# Patient Record
Sex: Female | Born: 1955 | Race: White | Hispanic: No | Smoking: Never smoker
Health system: Southern US, Community
[De-identification: ages and names within clinical notes are randomized; demographics above are authoritative.]

## PROBLEM LIST (undated history)

## (undated) DIAGNOSIS — F419 Anxiety disorder, unspecified: Principal | ICD-10-CM

## (undated) DIAGNOSIS — I10 Essential (primary) hypertension: Secondary | ICD-10-CM

## (undated) DIAGNOSIS — F329 Major depressive disorder, single episode, unspecified: Principal | ICD-10-CM

## (undated) DIAGNOSIS — K259 Gastric ulcer, unspecified as acute or chronic, without hemorrhage or perforation: Secondary | ICD-10-CM

## (undated) HISTORY — DX: Anxiety disorder, unspecified: F41.9

## (undated) HISTORY — DX: Major depressive disorder, single episode, unspecified: F32.9

## (undated) HISTORY — DX: Essential (primary) hypertension: I10

---

## 1996-06-02 HISTORY — PX: OTHER SURGICAL HISTORY: SHX169

## 2012-08-31 ENCOUNTER — Ambulatory Visit (INDEPENDENT_AMBULATORY_CARE_PROVIDER_SITE_OTHER): Payer: Self-pay | Admitting: Family Medicine

## 2012-08-31 ENCOUNTER — Encounter: Payer: Self-pay | Admitting: Family Medicine

## 2012-08-31 VITALS — BP 210/115 | HR 100 | Ht 61.0 in | Wt 126.0 lb

## 2012-08-31 DIAGNOSIS — F419 Anxiety disorder, unspecified: Secondary | ICD-10-CM

## 2012-08-31 DIAGNOSIS — F32A Depression, unspecified: Secondary | ICD-10-CM | POA: Insufficient documentation

## 2012-08-31 DIAGNOSIS — I160 Hypertensive urgency: Secondary | ICD-10-CM

## 2012-08-31 DIAGNOSIS — F329 Major depressive disorder, single episode, unspecified: Secondary | ICD-10-CM | POA: Insufficient documentation

## 2012-08-31 DIAGNOSIS — F341 Dysthymic disorder: Secondary | ICD-10-CM

## 2012-08-31 DIAGNOSIS — I10 Essential (primary) hypertension: Secondary | ICD-10-CM | POA: Insufficient documentation

## 2012-08-31 HISTORY — DX: Depression, unspecified: F32.A

## 2012-08-31 HISTORY — DX: Anxiety disorder, unspecified: F41.9

## 2012-08-31 MED ORDER — OLMESARTAN MEDOXOMIL-HCTZ 40-12.5 MG PO TABS: 1.0000 | ORAL_TABLET | Freq: Every day | ORAL | Status: DC

## 2012-08-31 MED ORDER — ALPRAZOLAM 1 MG PO TABS: 1.0000 mg | ORAL_TABLET | Freq: Three times a day (TID) | ORAL | Status: DC | PRN

## 2012-08-31 MED ORDER — OLMESARTAN-AMLODIPINE-HCTZ 40-5-25 MG PO TABS: ORAL_TABLET | ORAL | Status: DC

## 2012-08-31 MED ORDER — FLUOXETINE HCL 20 MG PO CAPS: 20.0000 mg | ORAL_CAPSULE | Freq: Every day | ORAL | Status: DC
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
## 2012-09-01 ENCOUNTER — Telehealth: Payer: Self-pay | Admitting: *Deleted

## 2012-09-01 NOTE — Telephone Encounter (Signed)
Sarah Mclaughlin or Sarah Mclaughlin, Given the degree of her elevated blood pressure yesterday and her symptoms of headache and vomiting I can't safely assume that this is only due to starting the BenicarHCT, unfortunately her situation now warrants Emergency Room attention, I'd recommend that she either call 911 or immediately go to an ED.

## 2012-09-01 NOTE — Telephone Encounter (Signed)
Called [facility] and informed her of MD instructions and she will go to the emergency room as directed. Barry Dienes, LPN

## 2012-09-01 NOTE — Telephone Encounter (Signed)
Patient calls this morning and states was seen yesterday and you put her on Benicar 40/12.5mg  take one daily for 7 days. Woke up during night last night with splitting headache and vomiting, chills. This morning still has splitting H/A and feels awful. Please advise

## 2012-09-06 ENCOUNTER — Telehealth: Payer: Self-pay | Admitting: *Deleted

## 2012-09-06 NOTE — Telephone Encounter (Signed)
Pt given Tribenzor and she has been taking half a tab of this and was throwing up and was told she needed to go to ED on the day she was in here to see you. Pt states she didn't go to ED. She wants to know if you will change her med, Tribenzor is too expensive. Also pt put on Prozac. Wants Prozac lowered to 10mg  dose and wants another med like xanax ?chlor something and she doesn't know what it is called

## 2012-09-07 ENCOUNTER — Telehealth: Payer: Self-pay | Admitting: *Deleted

## 2012-09-07 ENCOUNTER — Encounter: Payer: Self-pay | Admitting: Family Medicine

## 2012-09-07 ENCOUNTER — Ambulatory Visit (INDEPENDENT_AMBULATORY_CARE_PROVIDER_SITE_OTHER): Payer: Self-pay | Admitting: Family Medicine

## 2012-09-07 VITALS — BP 114/82 | HR 106 | Ht 61.0 in | Wt 122.0 lb

## 2012-09-07 DIAGNOSIS — F341 Dysthymic disorder: Secondary | ICD-10-CM

## 2012-09-07 DIAGNOSIS — I10 Essential (primary) hypertension: Secondary | ICD-10-CM

## 2012-09-07 DIAGNOSIS — F419 Anxiety disorder, unspecified: Secondary | ICD-10-CM

## 2012-09-07 MED ORDER — LISINOPRIL-HYDROCHLOROTHIAZIDE 20-25 MG PO TABS: 1.0000 | ORAL_TABLET | Freq: Every day | ORAL | Status: DC

## 2012-09-07 MED ORDER — HYDROXYZINE HCL 25 MG PO TABS: ORAL_TABLET | ORAL | Status: DC

## 2012-09-07 NOTE — Telephone Encounter (Signed)
Pt calls & states that she took the hydroxyzine today & after taking it she began to feel very dizzy & experienced some sob.  She called the pharmacist as well & he told her that those effects would last around 4 hrs.  Just wanted to make you aware.

## 2012-09-07 NOTE — Telephone Encounter (Signed)
Pt notified & is scheduled at 9am this morning.

## 2012-09-07 NOTE — Telephone Encounter (Signed)
Sue Lush, Will you please let Sarah Mclaughlin know that she needs to follow up to dicuss medication management given the degree of her elevated blood pressure.  I'd be more than happy to discuss medication options but I need to make sure her blood pressure is improving to help guide options.

## 2012-09-07 NOTE — Telephone Encounter (Signed)
noted 

## 2012-09-07 NOTE — Patient Instructions (Addendum)
Stop tribenzor, start lisinopril-hctz daily as a cheaper replacement.  For the next week only take half a xanax at bedtime to help wean you off, take with one hydroxyzine 25mg  for this week to help with anxiety/sleep, then just take hydroxyzine as needed.

## 2012-09-07 NOTE — Progress Notes (Signed)
CC: Sarah Mclaughlin is a 57 y.o. female is here for Medication Management   Subjective: HPI:  Followup hypertension: The day she started Benicar HCT she had vomiting and lightheadedness for 5 hours that resolved after a nap. The next day she started tribenzor samples and denies any further nausea, lightheadedness, nor motor or sensory disturbances. No outside blood pressures to report, she recently received a  blood pressure cuff but is not writing down pressures. She denies chest pain, shortness of breath, orthopnea, peripheral edema, muscle cramps, cough, angioedema.  Followup anxiety: Continues on Prozac 20 mg a day. She's been taking a single Xanax every evening right before bed . Her son-in-law and daughter are help managing this medication in hopes to minimize her need for this. She expresses interest in trying to come off Xanax. After discussing the medication with her family.  She reports significant improvement in generalized anxiety. She still has episodes of worsening anxiety when stressing out at work with responsibilities, or when not spending time with her son-in-law and daughter. She denies depression, mental disturbance, paranoia, nor sleep trouble   Review Of Systems Outlined In HPI  Past Medical History  Diagnosis Date  . Hypertension   . Anxiety and depression 08/31/2012     Family History  Problem Relation Age of Onset  . Heart attack Mother     father  . Diabetes Mother   . Hypertension Mother   . Stroke       History  Substance Use Topics  . Smoking status: Never Smoker   . Smokeless tobacco: Not on file  . Alcohol Use: Yes     Comment: occasional     Objective: Filed Vitals:   09/07/12 0859  BP: 114/82  Pulse: 106    General: Alert and Oriented, No Acute Distress HEENT: Pupils equal, round, reactive to light. Conjunctivae clear.   moist mucous membranes  Lungs: Clear to auscultation bilaterally, no wheezing/ronchi/rales.  Comfortable work of  breathing. Good air movement. Cardiac: Regular rate and rhythm. Normal S1/S2.  No murmurs, rubs, nor gallops.   Extremities: No peripheral edema.  Strong peripheral pulses.  Mental Status: No depression, anxiety, nor agitation.Significantly more calm compared to last visit  Skin: Warm and dry.  Assessment & Plan: Sarah Mclaughlin was seen today for medication management.  Diagnoses and associated orders for this visit:  Essential hypertension, benign - lisinopril-hydrochlorothiazide (PRINZIDE,ZESTORETIC) 20-25 MG per tablet; Take 1 tablet by mouth daily.  Anxiety and depression - hydrOXYzine (ATARAX/VISTARIL) 25 MG tablet; One by mouth at bedtime, may take one every 8 hours for anxiety attack.    Essential hypertension: Controlled, patient unable to afford tribenzor. Will change to lisinopril/hydrochlorothiazide for financial reasons and will add amlodipine if needed. Patient agreeable to bringing daily blood pressure log for the next week. Anxiety: Controlled and improved. Stressed importance of Prozac to help minimize anxiety fluctuations, will attempt to wean off of that time Xanax with addition and switch to hydroxyzine.  Return in about 4 weeks (around 10/05/2012).

## 2012-09-13 ENCOUNTER — Encounter: Payer: Self-pay | Admitting: Family Medicine

## 2012-09-13 ENCOUNTER — Ambulatory Visit (INDEPENDENT_AMBULATORY_CARE_PROVIDER_SITE_OTHER): Payer: Self-pay | Admitting: Family Medicine

## 2012-09-13 VITALS — BP 128/95 | HR 98 | Ht 61.0 in | Wt 118.0 lb

## 2012-09-13 DIAGNOSIS — I10 Essential (primary) hypertension: Secondary | ICD-10-CM

## 2012-09-13 DIAGNOSIS — F341 Dysthymic disorder: Secondary | ICD-10-CM

## 2012-09-13 DIAGNOSIS — F329 Major depressive disorder, single episode, unspecified: Secondary | ICD-10-CM

## 2012-09-13 DIAGNOSIS — F419 Anxiety disorder, unspecified: Secondary | ICD-10-CM

## 2012-09-13 MED ORDER — FLUOXETINE HCL 40 MG PO CAPS: 40.0000 mg | ORAL_CAPSULE | Freq: Every day | ORAL | Status: DC

## 2012-09-13 NOTE — Progress Notes (Signed)
CC: Sarah Mclaughlin is a 57 y.o. female is here for Anxiety   Subjective: HPI:  Followup hypertension: Since I saw her last she started lisinopril/hydrochlorothiazide. Only one outside blood pressure reading: 90/60. This occurred when she took her blood pressure medication on empty stomach without drinking much the night before or the morning. Was at work sweating working around the often and had gradual onset of weakness in the legs, lightheadedness. EMS was called and found the above blood pressure. She did not go to the hospital, went home instead and felt better after drinking and eating. Since then she denies chest pain, shortness of breath, orthopnea, peripheral edema, lightheadedness, headache, confusion.  Followup anxiety: Patient states the above event was preceded by extremely high stress at work, unclear job roll and responsibilities were contributing to confusion and anxiety.  She has tried taking hydroxyzine however does not notice any change anxiety for better or worse. She has not taken Xanax in over a week. She denies paranoia, hallucinations, depression, nor mental disturbance other than that described above   Review Of Systems Outlined In HPI  Past Medical History  Diagnosis Date  . Hypertension   . Anxiety and depression 08/31/2012     Family History  Problem Relation Age of Onset  . Heart attack Mother     father  . Diabetes Mother   . Hypertension Mother   . Stroke       History  Substance Use Topics  . Smoking status: Never Smoker   . Smokeless tobacco: Not on file  . Alcohol Use: Yes     Comment: occasional     Objective: Filed Vitals:   09/13/12 0839  BP: 128/95  Pulse: 98    Vital signs reviewed. General: Alert and Oriented, No Acute Distress HEENT: Pupils equal, round, reactive to light. Conjunctivae clear.  External ears unremarkable.  Moist mucous membranes. Lungs: Clear and comfortable work of breathing, speaking in full sentences without  accessory muscle use. No wheezing no rhonchi nor rales Cardiac: Regular rate and rhythm. No murmurs rubs or gallops Neuro: CN II-XII grossly intact, gait normal. Extremities: No peripheral edema.  Strong peripheral pulses.  Mental Status: No depression, anxiety, nor agitation. Logical though process. Skin: Warm and dry.  Assessment & Plan: Sarah Mclaughlin was seen today for anxiety.  Diagnoses and associated orders for this visit:  Essential hypertension, benign  Anxiety and depression - FLUoxetine (PROZAC) 40 MG capsule; Take 1 capsule (40 mg total) by mouth daily.    Essential hypertension: Improved however uncontrolled, discussed diet and exercise interventions including salt restrictions to help with diastolic. Continue lisinopril hydrochlorothiazide and recheck in 4 weeks.  Anxiety: Uncontrolled, will increase Prozac, update me over the phone in one week.   Return in about 4 weeks (around 10/11/2012).

## 2012-09-15 ENCOUNTER — Ambulatory Visit (INDEPENDENT_AMBULATORY_CARE_PROVIDER_SITE_OTHER): Payer: Self-pay | Admitting: Family Medicine

## 2012-09-15 ENCOUNTER — Encounter: Payer: Self-pay | Admitting: Family Medicine

## 2012-09-15 ENCOUNTER — Telehealth: Payer: Self-pay | Admitting: *Deleted

## 2012-09-15 VITALS — BP 106/82 | HR 92 | Wt 119.0 lb

## 2012-09-15 DIAGNOSIS — F411 Generalized anxiety disorder: Secondary | ICD-10-CM

## 2012-09-15 MED ORDER — BUSPIRONE HCL 10 MG PO TABS: 10.0000 mg | ORAL_TABLET | Freq: Two times a day (BID) | ORAL | Status: DC

## 2012-09-15 NOTE — Patient Instructions (Signed)
Take only 20mg  of prozac tomorrow, then start buspar twice a day on Friday.

## 2012-09-15 NOTE — Telephone Encounter (Signed)
Pt notified and has tried the meds but still feels horrible. Informed pt needs to make appointment and transferred her to scheduling. Barry Dienes, LPN

## 2012-09-15 NOTE — Telephone Encounter (Signed)
If this doesn't improve with peptobismol and or over the counter ranitidine 150mg  twice a day it would be best for her to be evaluated in clinic.

## 2012-09-15 NOTE — Progress Notes (Signed)
CC: Sarah Mclaughlin is a 57 y.o. female is here for Anxiety   Subjective: HPI:  Patient complains of an episode yesterday after taking increased dose of Prozac where she felt like a zombie, was moving slowly, had epigastric pain. This occurred at work and she was sent home after above symptoms were noticed by her employer per her report. As soon as she got home she had repeated episodes of vomiting and diarrhea. She went to sleep that afternoon and this morning woke up at 12 AM and has been awake since. She reports decreased appetite but does have the desire to be a big Mac or a shrimp cocktail right now. She denies any discomfort. She still complains of uncontrolled anxiety is present all hours of the day. She denies paranoia, hallucinations, fevers, chills, back pain, right upper quadrant pain, blood in bowels or vomit. Denies confusion.    Review Of Systems Outlined In HPI  Past Medical History  Diagnosis Date  . Hypertension   . Anxiety and depression 08/31/2012     Family History  Problem Relation Age of Onset  . Heart attack Mother     father  . Diabetes Mother   . Hypertension Mother   . Stroke       History  Substance Use Topics  . Smoking status: Never Smoker   . Smokeless tobacco: Not on file  . Alcohol Use: Yes     Comment: occasional     Objective: Filed Vitals:   09/15/12 1308  BP: 106/82  Pulse: 92    General: Alert and Oriented, No Acute Distress HEENT: Pupils equal, round, reactive to light. Conjunctivae clear.  Moist mucous membranes Lungs: Clear to auscultation bilaterally, no wheezing/ronchi/rales.  Comfortable work of breathing. Good air movement. Cardiac: Regular rate and rhythm. Normal S1/S2.  No murmurs, rubs, nor gallops.   Abdomen: Normal bowel sounds, soft and non tender without palpable masses. Extremities: No peripheral edema.  Strong peripheral pulses.  Mental Status: No depression, mild anxiety no agitation Skin: Warm and  dry.  Assessment & Plan: Tanika was seen today for anxiety.  Diagnoses and associated orders for this visit:  Generalized anxiety disorder - busPIRone (BUSPAR) 10 MG tablet; Take 1 tablet (10 mg total) by mouth 2 (two) times daily.    Generalized anxiety disorder: Uncontrolled, patient is concerned about possible side effects from increasing Prozac. She feels anxiety is actually worse since increasing. Discussed this would be unlikely given she's only had one day of increased however she's reserved about continuing this medication. We'll change to BuSpar, see patient instructions, I've asked her to have some blood work done to further workup her GI issues yesterday and reversible causes of anxiety however she declines citing financial issues. She refuses behavioral health referral do to financial issues.   Return in about 2 weeks (around 09/29/2012).

## 2012-09-15 NOTE — Telephone Encounter (Signed)
Pt calls and states that her stomach feels like its in knots under her rib cage, no appetite and when can eat starts vomiting, anxious, not sleeping well, stools are soft. ? What to do?

## 2012-09-21 ENCOUNTER — Telehealth: Payer: Self-pay | Admitting: Family Medicine

## 2012-09-21 DIAGNOSIS — F411 Generalized anxiety disorder: Secondary | ICD-10-CM

## 2012-09-21 MED ORDER — BUSPIRONE HCL 15 MG PO TABS: 15.0000 mg | ORAL_TABLET | Freq: Two times a day (BID) | ORAL | Status: DC

## 2012-09-21 NOTE — Telephone Encounter (Signed)
Patient lost Buspar rx and requesting increased dose.

## 2012-10-04 ENCOUNTER — Ambulatory Visit: Payer: Self-pay | Admitting: Family Medicine

## 2012-10-05 ENCOUNTER — Ambulatory Visit (INDEPENDENT_AMBULATORY_CARE_PROVIDER_SITE_OTHER): Payer: Self-pay | Admitting: Family Medicine

## 2012-10-05 ENCOUNTER — Encounter: Payer: Self-pay | Admitting: Family Medicine

## 2012-10-05 VITALS — BP 121/76 | HR 87 | Wt 122.0 lb

## 2012-10-05 DIAGNOSIS — F341 Dysthymic disorder: Secondary | ICD-10-CM

## 2012-10-05 DIAGNOSIS — F329 Major depressive disorder, single episode, unspecified: Secondary | ICD-10-CM

## 2012-10-05 DIAGNOSIS — I10 Essential (primary) hypertension: Secondary | ICD-10-CM

## 2012-10-05 DIAGNOSIS — F419 Anxiety disorder, unspecified: Secondary | ICD-10-CM

## 2012-10-05 MED ORDER — CITALOPRAM HYDROBROMIDE 20 MG PO TABS: 20.0000 mg | ORAL_TABLET | Freq: Every day | ORAL | Status: DC

## 2012-10-05 NOTE — Patient Instructions (Addendum)
Take half a dose of buspirone twice a day for three days then start half a citalopram a day for three days then a full citalopram 20mg  every day.

## 2012-10-05 NOTE — Progress Notes (Signed)
CC: Sarah Mclaughlin is a 57 y.o. female is here for Weight Gain   Subjective: HPI:  Followup hypertension: Continues on lisinopril hydrochlorothiazide 20-25 once a day. No outside blood pressures to report. She denies any more episodes of dizziness or fatigue that she was experiencing last visit. She denies headaches, motor sensory disturbances, chest pain, shortness of breath, orthopnea, nor peripheral edema. Denies cough or muscle cramping  Followup anxiety: She reports she is feeling fantastic and better than ever, others are noticing her lack of tremor and restlessness. Although her fantastic subjective improvement she is discouraged by increased hunger throughout the day and weight gain. She is wanting to know if his other alternatives to BuSpar that will not cause as much weight gain.  She denies depression, paranoia, hallucinations, nor trouble at work.    Review Of Systems Outlined In HPI  Past Medical History  Diagnosis Date  . Hypertension   . Anxiety and depression 08/31/2012     Family History  Problem Relation Age of Onset  . Heart attack Mother     father  . Diabetes Mother   . Hypertension Mother   . Stroke       History  Substance Use Topics  . Smoking status: Never Smoker   . Smokeless tobacco: Not on file  . Alcohol Use: Yes     Comment: occasional     Objective: Filed Vitals:   10/05/12 1123  BP: 121/76  Pulse: 87    Vital signs reviewed. General: Alert and Oriented, No Acute Distress HEENT: Pupils equal, round, reactive to light. Conjunctivae clear.  External ears unremarkable.  Moist mucous membranes. Lungs: Clear and comfortable work of breathing, speaking in full sentences without accessory muscle use. Cardiac: Regular rate and rhythm.  Neuro: CN II-XII grossly intact, gait normal. Extremities: No peripheral edema.  Strong peripheral pulses.  Mental Status: No depression, anxiety, nor agitation. Logical though process. Skin: Warm and  dry.  Assessment & Plan: Sarah Mclaughlin was seen today for weight gain.  Diagnoses and associated orders for this visit:  Anxiety and depression - Discontinue: citalopram (CELEXA) 20 MG tablet; Take 1 tablet (20 mg total) by mouth daily. - citalopram (CELEXA) 20 MG tablet; Take 1 tablet (20 mg total) by mouth daily. May increase to two a day after a week if anxiety not controlled.  Essential hypertension, benign    Anxiety and depression: Controlled however patient is particularly bothered by weight gain, discussed medication change may worsen or progress she's made from a psych standpoint, she would still prefer to try alternative and a regimen of Celexa was provided. Discussed that almost all SSRI/SNR I agents have the potential to cause weight gain. Essential hypertension: Controlled, continue lisinopril hydrochlorothiazide, declines labs due to financial reasons  Return in about 4 weeks (around 11/02/2012).

## 2012-10-21 ENCOUNTER — Telehealth: Payer: Self-pay | Admitting: *Deleted

## 2012-10-21 NOTE — Telephone Encounter (Signed)
Pt calls today stating that for the last week she has been experiencing SOB, dizzy & nausea spells, and she feels so exhausted in the mornings.  She has been on Celexa since 5-6.  Her labored breathing was very audible on the phone as I was speaking with her.  I advised her to go to the ER but she did not want to because she has no ins.  She made an appt with you for tmrw but I strongly urged her to go to the hosp if she got ANY worse.  Just wanted to give you a heads up.

## 2012-10-22 ENCOUNTER — Encounter: Payer: Self-pay | Admitting: Family Medicine

## 2012-10-22 ENCOUNTER — Ambulatory Visit (INDEPENDENT_AMBULATORY_CARE_PROVIDER_SITE_OTHER): Payer: Self-pay | Admitting: Family Medicine

## 2012-10-22 VITALS — BP 94/69 | HR 107 | Wt 123.0 lb

## 2012-10-22 DIAGNOSIS — F419 Anxiety disorder, unspecified: Secondary | ICD-10-CM

## 2012-10-22 DIAGNOSIS — R0602 Shortness of breath: Secondary | ICD-10-CM

## 2012-10-22 DIAGNOSIS — I10 Essential (primary) hypertension: Secondary | ICD-10-CM

## 2012-10-22 DIAGNOSIS — F341 Dysthymic disorder: Secondary | ICD-10-CM

## 2012-10-22 MED ORDER — LISINOPRIL-HYDROCHLOROTHIAZIDE 20-25 MG PO TABS: 0.5000 | ORAL_TABLET | Freq: Every day | ORAL | Status: DC

## 2012-10-22 MED ORDER — PAROXETINE HCL 20 MG PO TABS: 20.0000 mg | ORAL_TABLET | Freq: Every day | ORAL | Status: DC

## 2012-10-22 NOTE — Progress Notes (Signed)
CC: Sarah Mclaughlin is a 57 y.o. female is here for Anxiety   Subjective: HPI:  Patient complains of lightheadedness, shortness of breath, epigastric discomfort all started about one week after starting citalopram. Symptoms worsening on a weekly basis all of which are mild to moderate in severity. Lightheadedness is worse when standing from a seated position and improves with lying down or with time. Nothing else makes better or worse. Shortness of breath is noted with exertion and with periods of psychological stress and improves with left over Klonopin. She denies wheezing, cough, chest pain, orthopnea, peripheral edema, nor nasal congestion. Epigastric pain is described as a lump in her stomach that is nonradiating nothing makes better or worse. She also notes that she has dry heaves and nausea for one to 2 hours after taking daily dose of citalopram. She does report a history of stomach ulcers decades ago. No alcohol or nonsteroidal anti-inflammatory use.    Review Of Systems Outlined In HPI  Past Medical History  Diagnosis Date  . Hypertension   . Anxiety and depression 08/31/2012     Family History  Problem Relation Age of Onset  . Heart attack Mother     father  . Diabetes Mother   . Hypertension Mother   . Stroke       History  Substance Use Topics  . Smoking status: Never Smoker   . Smokeless tobacco: Not on file  . Alcohol Use: Yes     Comment: occasional     Objective: Filed Vitals:   10/22/12 1017  BP: 94/69  Pulse: 107    General: Alert and Oriented, No Acute Distress HEENT: Pupils equal, round, reactive to light. Conjunctivae clear.  External ears unremarkable, canals clear with intact TMs with appropriate landmarks.  Middle ear appears open without effusion. Pink inferior turbinates.  Moist mucous membranes, pharynx without inflammation nor lesions.  Neck supple without palpable lymphadenopathy nor abnormal masses. Lungs: Clear to auscultation bilaterally, no  wheezing/ronchi/rales.  Comfortable work of breathing. Good air movement. Cardiac: Regular rate and rhythm. Normal S1/S2.  No murmurs, rubs, nor gallops.   Abdomen: Normal bowel sounds, soft and non tender without palpable masses. Extremities: No peripheral edema.  Strong peripheral pulses.  Mental Status: No depression, anxiety, nor agitation. Skin: Warm and dry.  Assessment & Plan: Tenna was seen today for anxiety.  Diagnoses and associated orders for this visit:  Anxiety and depression - PARoxetine (PAXIL) 20 MG tablet; Take 1 tablet (20 mg total) by mouth daily.  Essential hypertension, benign - lisinopril-hydrochlorothiazide (PRINZIDE,ZESTORETIC) 20-25 MG per tablet; Take 0.5 tablets by mouth daily.  Shortness of breath - POCT hemoglobin    Anxiety and depression: Controlled, epigastric pain and nausea may be a side effect of citalopram, she is quite worried about the side effect possibility therefore will switch to Paxil for anxiety control. Essential hypertension: Controlled, treatment likely too aggressive at this time causing symptomatic hypo-tension therefore cut tablet in half taking only half a dose today Shortness of breath: I've asked her to get a d-dimer to rule out PE however she refuses,  No sign of infection nor heart failure, she would prefer to withhold further investigation into trying the adjustments above  Return in about 4 weeks (around 11/19/2012).

## 2012-10-26 ENCOUNTER — Telehealth: Payer: Self-pay | Admitting: *Deleted

## 2012-10-26 DIAGNOSIS — F411 Generalized anxiety disorder: Secondary | ICD-10-CM

## 2012-10-26 MED ORDER — CLONAZEPAM 0.5 MG PO TABS: 0.2500 mg | ORAL_TABLET | Freq: Two times a day (BID) | ORAL | Status: DC | PRN

## 2012-10-26 NOTE — Telephone Encounter (Signed)
Pt notified and rx faxed 

## 2012-10-26 NOTE — Telephone Encounter (Signed)
Sue Lush, Rx for clonopin placed in you inbox ready for faxing or pickup.
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
## 2012-11-03 ENCOUNTER — Ambulatory Visit (INDEPENDENT_AMBULATORY_CARE_PROVIDER_SITE_OTHER): Payer: Self-pay | Admitting: Family Medicine

## 2012-11-03 ENCOUNTER — Other Ambulatory Visit: Payer: Self-pay | Admitting: Family Medicine

## 2012-11-03 ENCOUNTER — Encounter: Payer: Self-pay | Admitting: Family Medicine

## 2012-11-03 VITALS — BP 139/103 | HR 91 | Wt 127.0 lb

## 2012-11-03 DIAGNOSIS — F419 Anxiety disorder, unspecified: Secondary | ICD-10-CM

## 2012-11-03 DIAGNOSIS — F329 Major depressive disorder, single episode, unspecified: Secondary | ICD-10-CM

## 2012-11-03 DIAGNOSIS — F341 Dysthymic disorder: Secondary | ICD-10-CM

## 2012-11-03 MED ORDER — CITALOPRAM HYDROBROMIDE 20 MG PO TABS: ORAL_TABLET | ORAL | Status: DC

## 2012-11-03 NOTE — Telephone Encounter (Signed)
Pt & pharmacy notified of med instructions.
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
## 2012-11-03 NOTE — Telephone Encounter (Signed)
(  For documentation purposes, her updated med-list was printed and then reviewed at today's encounter numerous times)  Sarah Mclaughlin, I have recommended that she stop the Paxil/paroxetine and go back to Citalopram/celexa 20mg  for the first four days then two by mouth daily thereafter.  She can take both hydroxyzine and clonazepam, hydroxyzine is first line for as needed anxiety treatment, clonazepam is second line for as needed anxiety treatment.  Hope this helps.

## 2012-11-03 NOTE — Telephone Encounter (Signed)
Pharmacy calls and states this patient called them and was confused about her medicine. She doesn't know if she is supposed to take her hydroxyzine along with the Klonopin and does she take the Celexa and Paxil. I see where you said stop Paxil and resume the Celexa as this worked best for her. Can you clarify the other for me please. Barry Dienes, LPN

## 2012-11-05 ENCOUNTER — Telehealth: Payer: Self-pay | Admitting: *Deleted

## 2012-11-05 DIAGNOSIS — F411 Generalized anxiety disorder: Secondary | ICD-10-CM

## 2012-11-05 MED ORDER — CLONAZEPAM 0.5 MG PO TABS: 0.2500 mg | ORAL_TABLET | Freq: Two times a day (BID) | ORAL | Status: DC | PRN

## 2012-11-05 NOTE — Telephone Encounter (Signed)
Pt calls and wants to know if you can call in the Klonopin 0.5mg  to [facility]. Leaving out of town in 2 hours and really needs this before she leaves.

## 2012-11-05 NOTE — Telephone Encounter (Signed)
Pt notified of instructions on meds. Barry Dienes, LPN

## 2012-11-05 NOTE — Telephone Encounter (Signed)
Pt calls and and ask if you will increase the Klonopin to taking a whole tab instead of 1/2 twice a day. States she has been taking a whole tab 2 times a day because of stress and anxiety right now

## 2012-11-05 NOTE — Telephone Encounter (Signed)
Rx printed ready for faxing off. Given to [place].

## 2012-11-05 NOTE — Telephone Encounter (Signed)
It is in her best interest to stick with just half a tab twice a day to minimize the risk of developing a tolerance or dependency on this type of medication.  I would recommend taking two of the hydroxyzine every 8 hours since this medication is not associated with dependency.

## 2012-11-15 ENCOUNTER — Ambulatory Visit (INDEPENDENT_AMBULATORY_CARE_PROVIDER_SITE_OTHER): Payer: Self-pay | Admitting: Family Medicine

## 2012-11-15 ENCOUNTER — Encounter: Payer: Self-pay | Admitting: Family Medicine

## 2012-11-15 VITALS — BP 104/76 | HR 100 | Wt 127.0 lb

## 2012-11-15 DIAGNOSIS — F341 Dysthymic disorder: Secondary | ICD-10-CM

## 2012-11-15 DIAGNOSIS — F419 Anxiety disorder, unspecified: Secondary | ICD-10-CM

## 2012-11-15 DIAGNOSIS — F411 Generalized anxiety disorder: Secondary | ICD-10-CM

## 2012-11-15 DIAGNOSIS — I10 Essential (primary) hypertension: Secondary | ICD-10-CM

## 2012-11-15 MED ORDER — LISINOPRIL-HYDROCHLOROTHIAZIDE 20-25 MG PO TABS: 0.5000 | ORAL_TABLET | Freq: Every day | ORAL | Status: DC

## 2012-11-15 MED ORDER — CITALOPRAM HYDROBROMIDE 20 MG PO TABS: ORAL_TABLET | ORAL | Status: DC

## 2012-11-15 MED ORDER — CLONAZEPAM 0.5 MG PO TABS: 0.5000 mg | ORAL_TABLET | Freq: Three times a day (TID) | ORAL | Status: DC | PRN

## 2012-11-15 NOTE — Progress Notes (Signed)
CC: Sarah Mclaughlin is a 57 y.o. female is here for Anxiety   Subjective: HPI:  Followup anxiety: Patient currently states anxiety level is moderate to severe, worse during periods of inactivity while not at work, for example when bored at home. Describes restlessness without fear of anything in particular. She restarted Celexa but stopped after a week getting no improvement. She was taking Klonopin a full tablet twice a day due to confusion about dosing regimen, this did help symptoms come down to moderate degree. Denies depression, paranoia, manic-like episodes, nor mental disturbance otherwise. Denies fevers, chills, unintentional weight loss, nausea nor GI disturbance   Review Of Systems Outlined In HPI  Past Medical History  Diagnosis Date  . Hypertension   . Anxiety and depression 08/31/2012     Family History  Problem Relation Age of Onset  . Heart attack Mother     father  . Diabetes Mother   . Hypertension Mother   . Stroke       History  Substance Use Topics  . Smoking status: Never Smoker   . Smokeless tobacco: Not on file  . Alcohol Use: Yes     Comment: occasional     Objective: Filed Vitals:   11/15/12 1541  BP: 104/76  Pulse: 100    Vital signs reviewed. General: Alert and Oriented, No Acute Distress HEENT: Pupils equal, round, reactive to light. Conjunctivae clear.  External ears unremarkable.  Moist mucous membranes. Lungs: Clear and comfortable work of breathing, speaking in full sentences without accessory muscle use. Cardiac: Regular rate and rhythm.  Neuro: CN II-XII grossly intact, gait normal. Extremities: No peripheral edema.  Strong peripheral pulses.  Mental Status: Mild agitation and anxiety, no depression Logical though process. Skin: Warm and dry.  Assessment & Plan: Sarah Mclaughlin was seen today for anxiety.  Diagnoses and associated orders for this visit:  Generalized anxiety disorder - clonazePAM (KLONOPIN) 0.5 MG tablet; Take 1 tablet  (0.5 mg total) by mouth 3 (three) times daily as needed for anxiety.  Essential hypertension, benign - lisinopril-hydrochlorothiazide (PRINZIDE,ZESTORETIC) 20-25 MG per tablet; Take 0.5 tablets by mouth daily.  Anxiety and depression - citalopram (CELEXA) 20 MG tablet; One by mouth daily for the first four days then two by mouth daily.    Generalized anxiety disorder: Uncontrolled, stressed the importance of taking Celexa on a daily basis for the best benefit.  Given her overall lack of improving symptoms will start 3 times a day as needed dosing for Klonopin. She is still trying to get in [facility]  Return in about 4 weeks (around 12/13/2012).

## 2012-11-17 ENCOUNTER — Telehealth: Payer: Self-pay | Admitting: *Deleted

## 2012-11-17 NOTE — Telephone Encounter (Signed)
Left message on pt's vm and faxed letter this am to number she provided

## 2012-11-17 NOTE — Telephone Encounter (Signed)
Patient calls and LMOM wanting to know if you would fax a note to her work that's brief stating she was seen in office on 11/15/2012.  Fax to [phone]

## 2012-11-17 NOTE — Telephone Encounter (Signed)
Sue Lush, Note in inbox ready for pickup/fax

## 2012-11-22 ENCOUNTER — Encounter: Payer: Self-pay | Admitting: Family Medicine

## 2012-11-22 ENCOUNTER — Ambulatory Visit (INDEPENDENT_AMBULATORY_CARE_PROVIDER_SITE_OTHER): Payer: Self-pay | Admitting: Family Medicine

## 2012-11-22 VITALS — BP 101/69 | HR 83 | Temp 98.1°F | Resp 16 | Ht 61.0 in | Wt 127.0 lb

## 2012-11-22 DIAGNOSIS — S0010XA Contusion of unspecified eyelid and periocular area, initial encounter: Secondary | ICD-10-CM

## 2012-11-22 DIAGNOSIS — M79609 Pain in unspecified limb: Secondary | ICD-10-CM

## 2012-11-22 DIAGNOSIS — M79671 Pain in right foot: Secondary | ICD-10-CM

## 2012-11-22 DIAGNOSIS — S0012XA Contusion of left eyelid and periocular area, initial encounter: Secondary | ICD-10-CM

## 2012-11-22 MED ORDER — DICLOFENAC SODIUM 50 MG PO TBEC: DELAYED_RELEASE_TABLET | ORAL | Status: DC

## 2012-11-22 NOTE — Progress Notes (Signed)
CC: Sarah Mclaughlin is a 57 y.o. female is here for Eye Injury and Foot Injury   Subjective: HPI:  Patient complains of left eye with swelling along with discomfort that has been present ever since this morning. Both are described as moderate in severity. She reports tripping over a vacuum cleaner cord yesterday afternoon fell into a coffee table striking her right eyebrow. Since the accident until she went to bed she reports mild forehead tenderness but no swelling nor vision loss nor pain with movement of the left eye.  She is unable to lift the left eyelid due to swelling. She has tried icing for a few minutes this morning which helped the pain nothing else is made swelling or pain better or worse in her. She denies pain with movement of the left eye, she denies radiation of pain And localizes it to the left eyebrow. She denies headache, dizziness, motor sensory disturbances. She denies discharge from the left eye no foreign body sensation in the left eye. She denies fevers, chills, hearing loss, coordination difficulties or memory loss  She complains that she twisted her right foot while falling. It is described as a mild soreness localized on the anterior aspect of the ankle nonradiating. There is some bruising but no swelling. It hurts to walk but she was able to bear weight immediately and since.    Review Of Systems Outlined In HPI  Past Medical History  Diagnosis Date  . Hypertension   . Anxiety and depression 08/31/2012     Family History  Problem Relation Age of Onset  . Heart attack Mother     father  . Diabetes Mother   . Hypertension Mother   . Stroke       History  Substance Use Topics  . Smoking status: Never Smoker   . Smokeless tobacco: Not on file  . Alcohol Use: Yes     Comment: occasional     Objective: Filed Vitals:   11/22/12 0920  BP: 101/69  Pulse: 83  Temp: 98.1 F (36.7 C)  Resp: 16    General: Alert and Oriented, No Acute Distress HEENT:  Pupils equal, round, reactive to light. Conjunctivae clear.   the left upper lid has a moderate hematoma, she is unable to lift the left eyelid, I can passively with this with discomfort and she confirms vision is maintained after lifting the lid in the left eye .External ears unremarkable, canals clear with intact TMs with appropriate landmarks.  Middle ear appears open without effusion. Pink inferior turbinates.  Moist mucous membranes, pharynx without inflammation nor lesions.  Neck supple without palpable lymphadenopathy nor abnormal masses. Lungs:  clear comfortable work of breathing  Cardiac: Regular rate and rhythm.  Extremities: No peripheral edema.  Strong peripheral pulses. She's able to bear weight and walk across the clinic with mild discomfort of right ankle. Right ankle: Anterior drawer negative full range of motion and strength, no pain at base of fifth metatarsal, no pain of lateral or medial malleoli, no navicular pain, there is mild ecchymosis without swelling in the anterior aspect of the ankle. Neuro: Cranial nerves II through XII grossly intact  Mental Status: No depression, anxiety, nor agitation. Skin: Warm and dry.  Assessment & Plan: Sarah Mclaughlin was seen today for eye injury and foot injury.  Diagnoses and associated orders for this visit:  Right foot pain - diclofenac (VOLTAREN) 50 MG EC tablet; Take one tablet every 8 hours only as needed for pain, take with small snack.  Contusion of left eyelid, initial encounter - diclofenac (VOLTAREN) 50 MG EC tablet; Take one tablet every 8 hours only as needed for pain, take with small snack.     right foot pain: Discussed with patient I think this is fortunately just a sprain and contusion, focus on RICE. Alphabet range of motion exercises Her left eyelid contusion looks quite remarkable, fortunately I do not believe she's injured her eye/globe, she was unable to do Snellen due to pain of passively lift in left eyelid.  Vision is  grossly intact on exam, discussed icing and warning signs to look out for with respect to neuro compromise.  Return if symptoms worsen or fail to improve.

## 2012-11-24 ENCOUNTER — Telehealth: Payer: Self-pay | Admitting: *Deleted

## 2012-11-24 DIAGNOSIS — R519 Headache, unspecified: Secondary | ICD-10-CM

## 2012-11-24 MED ORDER — MELOXICAM 15 MG PO TABS: 15.0000 mg | ORAL_TABLET | Freq: Every day | ORAL | Status: DC

## 2012-11-24 NOTE — Telephone Encounter (Signed)
Patient called stated she has been calling all day and know one has returned her phone call. She is a patient of Dr. Genelle Bal I advised her that Sue Lush is not in office. Pt req a call back thanks. [phone]

## 2012-11-24 NOTE — Telephone Encounter (Signed)
Pt calls and states that the med you gave her Monday she can not keep anything down with taking it and her eye is still swollen and the swelling has moved down to her cheek and jawbone and in a lot of pain and looks horrible. Wants to know what to do if needs different med or what?

## 2012-11-24 NOTE — Telephone Encounter (Signed)
Already spoke to Sarah Mclaughlin to relay message that i'm worried about the vomiting and worsening swelling, I've encouraged her to go to a local ED now for evaluation as I think she most likely needs a CT scan of the head.  I've sent in mobic to be tried if the ED does not address the pain adequately.

## 2012-11-24 NOTE — Telephone Encounter (Signed)
Spoke with Dr. Ivan Anchors and he advised me to inform pt to go to emergency room as it sounds like it is getting worse and does not need to wait until tomorrow. Also he is sending in a new med Mobic that she can take and stop the diclofenac. Spoke with pt and informed her of his instructions and she will proceed to ED. Barry Dienes, LPN

## 2012-12-07 ENCOUNTER — Other Ambulatory Visit: Payer: Self-pay | Admitting: Family Medicine

## 2012-12-07 DIAGNOSIS — F411 Generalized anxiety disorder: Secondary | ICD-10-CM

## 2012-12-08 NOTE — Telephone Encounter (Signed)
Sarah Mclaughlin, Rx in inbox ready for pickup/fax. 

## 2012-12-13 ENCOUNTER — Ambulatory Visit (INDEPENDENT_AMBULATORY_CARE_PROVIDER_SITE_OTHER): Payer: Self-pay | Admitting: Family Medicine

## 2012-12-13 ENCOUNTER — Ambulatory Visit (INDEPENDENT_AMBULATORY_CARE_PROVIDER_SITE_OTHER): Payer: Self-pay

## 2012-12-13 ENCOUNTER — Encounter: Payer: Self-pay | Admitting: Family Medicine

## 2012-12-13 VITALS — BP 146/89 | HR 81 | Temp 98.0°F | Wt 137.0 lb

## 2012-12-13 DIAGNOSIS — R319 Hematuria, unspecified: Secondary | ICD-10-CM

## 2012-12-13 DIAGNOSIS — K920 Hematemesis: Secondary | ICD-10-CM

## 2012-12-13 DIAGNOSIS — R143 Flatulence: Secondary | ICD-10-CM

## 2012-12-13 DIAGNOSIS — R1013 Epigastric pain: Secondary | ICD-10-CM

## 2012-12-13 DIAGNOSIS — R141 Gas pain: Secondary | ICD-10-CM

## 2012-12-13 DIAGNOSIS — R109 Unspecified abdominal pain: Secondary | ICD-10-CM

## 2012-12-13 LAB — POCT URINALYSIS DIPSTICK
Protein, UA: NEGATIVE
Spec Grav, UA: 1.015
Urobilinogen, UA: 0.2

## 2012-12-13 MED ORDER — DEXLANSOPRAZOLE 60 MG PO CPDR: 60.0000 mg | DELAYED_RELEASE_CAPSULE | Freq: Every day | ORAL | Status: DC

## 2012-12-13 MED ORDER — HYDROCODONE-ACETAMINOPHEN 5-325 MG PO TABS: 1.0000 | ORAL_TABLET | Freq: Four times a day (QID) | ORAL | Status: DC | PRN

## 2012-12-13 NOTE — Progress Notes (Signed)
CC: Sarah Mclaughlin is a 57 y.o. female is here for Hematuria   Subjective: HPI:  Patient complains of 3 weeks of abdominal bloating and epigastric pain that is moderate severity present on a daily basis associated with decreased appetite. Over the past week she has vomited one to 3 times a day with scant coffee ground emesis otherwise appears to be partially digested food. She has changed her diet to a bland diet without much improvement. She also reports a daily bowel movement painless but black appearance. Pain is described as burning and sharp that radiates into the back. Nothing particularly makes it better or worse no interventions as of yet. Food does not seem to make better or worse. She has had almost no nausea. She does not believe that she has had peripheral edema or swelling she has not changed her diet other than above. She does endorse as she describes it very little shortness of breath and fatigue.  She has been using nonsteroidal anti-inflammatories either Mobic or ibuprofen on a daily basis she's unsure of the dosages. She is not a smoker and rarely drinks alcohol. She denies right upper quadrant pain, skin or scleral discoloration, fevers, chills, confusion, shortness of breath, diarrhea, pelvic pain, flank pain.  She mentions that she has noticed a streak of blood after wiping her urethra the past 2 days without dysuria, frequency, urgency, nor hesitancy. She tells me she is noticed no vaginal bleeding and has not had a period in almost 5 years. She denies any vaginal discharge rectal pain or rectal bleeding.   Review Of Systems Outlined In HPI  Past Medical History  Diagnosis Date  . Hypertension   . Anxiety and depression 08/31/2012     Family History  Problem Relation Age of Onset  . Heart attack Mother     father  . Diabetes Mother   . Hypertension Mother   . Stroke       History  Substance Use Topics  . Smoking status: Never Smoker   . Smokeless tobacco: Not on  file  . Alcohol Use: Yes     Comment: occasional     Objective: Filed Vitals:   12/13/12 1317  BP: 146/89  Pulse: 81  Temp: 98 F (36.7 C)    General: Alert and Oriented, No Acute Distress HEENT: Pupils equal, round, reactive to light. Conjunctivae clear.  Moist mucous membranes pharynx unremarkable Lungs: Clear to auscultation bilaterally, no wheezing/ronchi/rales.  Comfortable work of breathing. Good air movement. Cardiac: Regular rate and rhythm. Normal S1/S2.  No murmurs, rubs, nor gallops.   Abdomen: Normal bowel sounds, soft without palpable masses, she does appear mildly bloated compared to baseline, there is epigastric tenderness without guarding or rebound without pain in any other quadrant Extremities: No peripheral edema.  Strong peripheral pulses.  Mental Status: No depression, anxiety, nor agitation. Skin: Warm and dry.  Assessment & Plan: Sarah Mclaughlin was seen today for hematuria.  Diagnoses and associated orders for this visit:  Hematuria - Urinalysis Dipstick - Urine Culture  Coffee ground emesis - CBC - COMPLETE METABOLIC PANEL WITH GFR - Lipase - DG Abd 2 Views; Future - HYDROcodone-acetaminophen (NORCO/VICODIN) 5-325 MG per tablet; Take 1 tablet by mouth every 6 (six) hours as needed for pain. - dexlansoprazole (DEXILANT) 60 MG capsule; Take 1 capsule (60 mg total) by mouth daily. - Ambulatory referral to Gastroenterology  Abdominal pain, epigastric - HYDROcodone-acetaminophen (NORCO/VICODIN) 5-325 MG per tablet; Take 1 tablet by mouth every 6 (six) hours as needed  for pain. - dexlansoprazole (DEXILANT) 60 MG capsule; Take 1 capsule (60 mg total) by mouth daily. - Ambulatory referral to Gastroenterology    Coffee ground emesis: There is concern for gastric ulcer with bleeding fortunately I believe she does not warrant emergent intervention but we will attempt to arrange GI referral either today or tomorrow for consideration of upper endoscopy. Obtain  platelets and hemoglobin, will rule out hepatic or pancreatic etiology of pain with the above labs. Stop all nonsteroidal anti-inflammatories, sample of dexillant provided until she is able to see GI 10 days given. Abdominal film obtained to rule out obstruction fortunately without signs of obstruction today.Signs and symptoms requring emergent/urgent reevaluation were discussed with the patient. Hematuria confirmed on dipstick, without classic urinary tract symptoms will hold off until cultures available.  Return if symptoms worsen or fail to improve.

## 2012-12-14 ENCOUNTER — Telehealth: Payer: Self-pay | Admitting: *Deleted

## 2012-12-14 ENCOUNTER — Encounter: Payer: Self-pay | Admitting: Family Medicine

## 2012-12-15 ENCOUNTER — Telehealth: Payer: Self-pay | Admitting: *Deleted

## 2012-12-15 LAB — URINE CULTURE: Colony Count: NO GROWTH

## 2012-12-15 NOTE — Telephone Encounter (Signed)
Pt appt with [facility] was scheduled for this past Mon at 945 With [facility]. Pt was aware of appt time and did keep appt

## 2012-12-17 ENCOUNTER — Emergency Department (HOSPITAL_COMMUNITY)
Admission: EM | Admit: 2012-12-17 | Discharge: 2012-12-18 | Disposition: A | Discharge status: Home / Self Care | Payer: 59 | Attending: Emergency Medicine | Admitting: Emergency Medicine

## 2012-12-17 ENCOUNTER — Ambulatory Visit (INDEPENDENT_AMBULATORY_CARE_PROVIDER_SITE_OTHER): Payer: Self-pay | Admitting: Family Medicine

## 2012-12-17 ENCOUNTER — Emergency Department (HOSPITAL_COMMUNITY): Payer: Self-pay

## 2012-12-17 ENCOUNTER — Other Ambulatory Visit: Payer: Self-pay | Admitting: Family Medicine

## 2012-12-17 ENCOUNTER — Encounter (HOSPITAL_COMMUNITY): Payer: Self-pay | Admitting: *Deleted

## 2012-12-17 ENCOUNTER — Encounter: Payer: Self-pay | Admitting: Family Medicine

## 2012-12-17 ENCOUNTER — Telehealth: Payer: Self-pay | Admitting: *Deleted

## 2012-12-17 VITALS — BP 132/84 | HR 74 | Wt 137.0 lb

## 2012-12-17 DIAGNOSIS — F22 Delusional disorders: Secondary | ICD-10-CM

## 2012-12-17 DIAGNOSIS — M7989 Other specified soft tissue disorders: Secondary | ICD-10-CM | POA: Insufficient documentation

## 2012-12-17 DIAGNOSIS — R1110 Vomiting, unspecified: Secondary | ICD-10-CM | POA: Insufficient documentation

## 2012-12-17 DIAGNOSIS — R634 Abnormal weight loss: Secondary | ICD-10-CM | POA: Insufficient documentation

## 2012-12-17 DIAGNOSIS — F411 Generalized anxiety disorder: Secondary | ICD-10-CM | POA: Insufficient documentation

## 2012-12-17 DIAGNOSIS — R443 Hallucinations, unspecified: Secondary | ICD-10-CM | POA: Insufficient documentation

## 2012-12-17 DIAGNOSIS — Z79899 Other long term (current) drug therapy: Secondary | ICD-10-CM | POA: Insufficient documentation

## 2012-12-17 DIAGNOSIS — F1110 Opioid abuse, uncomplicated: Secondary | ICD-10-CM | POA: Insufficient documentation

## 2012-12-17 DIAGNOSIS — R42 Dizziness and giddiness: Secondary | ICD-10-CM | POA: Insufficient documentation

## 2012-12-17 DIAGNOSIS — F333 Major depressive disorder, recurrent, severe with psychotic symptoms: Secondary | ICD-10-CM

## 2012-12-17 DIAGNOSIS — R5381 Other malaise: Secondary | ICD-10-CM | POA: Insufficient documentation

## 2012-12-17 DIAGNOSIS — R630 Anorexia: Secondary | ICD-10-CM | POA: Insufficient documentation

## 2012-12-17 DIAGNOSIS — Z8719 Personal history of other diseases of the digestive system: Secondary | ICD-10-CM | POA: Insufficient documentation

## 2012-12-17 DIAGNOSIS — K259 Gastric ulcer, unspecified as acute or chronic, without hemorrhage or perforation: Secondary | ICD-10-CM | POA: Insufficient documentation

## 2012-12-17 DIAGNOSIS — I10 Essential (primary) hypertension: Secondary | ICD-10-CM | POA: Insufficient documentation

## 2012-12-17 DIAGNOSIS — R4182 Altered mental status, unspecified: Secondary | ICD-10-CM

## 2012-12-17 DIAGNOSIS — K253 Acute gastric ulcer without hemorrhage or perforation: Secondary | ICD-10-CM

## 2012-12-17 HISTORY — DX: Gastric ulcer, unspecified as acute or chronic, without hemorrhage or perforation: K25.9

## 2012-12-17 LAB — CBC
Hemoglobin: 8.1 g/dL — ABNORMAL LOW (ref 12.0–15.0)
MCHC: 32.4 g/dL (ref 30.0–36.0)
Platelets: 372 10*3/uL (ref 150–400)
RDW: 14.1 % (ref 11.5–15.5)

## 2012-12-17 LAB — COMPREHENSIVE METABOLIC PANEL
ALT: 16 U/L (ref 0–35)
AST: 22 U/L (ref 0–37)
Albumin: 2.9 g/dL — ABNORMAL LOW (ref 3.5–5.2)
Alkaline Phosphatase: 97 U/L (ref 39–117)
Glucose, Bld: 105 mg/dL — ABNORMAL HIGH (ref 70–99)
Potassium: 4.9 mEq/L (ref 3.5–5.1)
Sodium: 136 mEq/L (ref 135–145)
Total Protein: 7.1 g/dL (ref 6.0–8.3)

## 2012-12-17 LAB — RAPID URINE DRUG SCREEN, HOSP PERFORMED
Amphetamines: NOT DETECTED
Barbiturates: NOT DETECTED
Benzodiazepines: NOT DETECTED
Cocaine: NOT DETECTED
Opiates: POSITIVE — AB
Tetrahydrocannabinol: NOT DETECTED

## 2012-12-17 LAB — ACETAMINOPHEN LEVEL: Acetaminophen (Tylenol), Serum: <15 ug/mL (ref 10–30)

## 2012-12-17 MED ORDER — LORAZEPAM 1 MG PO TABS
1.0000 mg | ORAL_TABLET | Freq: Three times a day (TID) | ORAL | Status: DC | PRN
  Administered 2012-12-17 – 2012-12-18 (×3): 1 mg via ORAL
  Filled 2012-12-17 (×3): qty 1

## 2012-12-17 MED ORDER — IBUPROFEN 200 MG PO TABS: 600.0000 mg | ORAL_TABLET | Freq: Three times a day (TID) | ORAL | Status: DC | PRN

## 2012-12-17 MED ORDER — ZOLPIDEM TARTRATE 5 MG PO TABS
5.0000 mg | ORAL_TABLET | Freq: Every evening | ORAL | Status: DC | PRN
  Administered 2012-12-17: 5 mg via ORAL
  Filled 2012-12-17: qty 1

## 2012-12-17 MED ORDER — NICOTINE 21 MG/24HR TD PT24: 21.0000 mg | MEDICATED_PATCH | Freq: Every day | TRANSDERMAL | Status: DC | PRN

## 2012-12-17 MED ORDER — ACETAMINOPHEN 325 MG PO TABS
650.0000 mg | ORAL_TABLET | ORAL | Status: DC | PRN
  Administered 2012-12-18: 650 mg via ORAL
  Filled 2012-12-17: qty 2

## 2012-12-17 MED ORDER — LISINOPRIL 10 MG PO TABS
10.0000 mg | ORAL_TABLET | Freq: Every day | ORAL | Status: DC
  Administered 2012-12-18: 10 mg via ORAL
  Filled 2012-12-17: qty 1

## 2012-12-17 MED ORDER — HYDROCHLOROTHIAZIDE 12.5 MG PO CAPS
12.5000 mg | ORAL_CAPSULE | Freq: Every day | ORAL | Status: DC
  Administered 2012-12-18: 12.5 mg via ORAL
  Filled 2012-12-17: qty 1

## 2012-12-17 MED ORDER — ONDANSETRON HCL 4 MG PO TABS: 4.0000 mg | ORAL_TABLET | Freq: Three times a day (TID) | ORAL | Status: DC | PRN

## 2012-12-17 MED ORDER — LISINOPRIL-HYDROCHLOROTHIAZIDE 20-25 MG PO TABS: 0.5000 | ORAL_TABLET | Freq: Every day | ORAL | Status: DC

## 2012-12-17 NOTE — Progress Notes (Signed)
CC: Sarah Mclaughlin is a 57 y.o. female is here for discuss anxiety   Subjective: HPI:  Patient is accompanied by Morrie Sheldon and Arlys John daughter and son-in-law  She is brought in due to concerns of  Decline in mental health since Wednesday of this week. The family reports she is having visual hallucinations at home and out in public for example she believes her daughter has been with her at home despite the daughter being at work all day, she was perceiving a magazine in our waiting room was a Development worker, international aid. She also seems to be in a confused state: She tells me she is hosting a Civil Service fast streamer which is not true, she arrived at our clinic today earlier today for an appointment that did not exist (but did prompt today's later vist), she believes that she has an MRI scheduled for her stomach earlier this week which is not true, she has told me that her former significant other was visiting her back in June however her daughter tells me this is impossible due to his transportation issues, earlier this month the patient told me she had lost her bottle of Klonopin at a [place] the beach however the family informs me there was no beach trip.  While reviewing medications with the family the daughter states she has never seen a citalopram prescription, they confirm she is taking dexilant, they found an empty bottle of hydrocodone and are unsure how she has been taking this she was prescribed 30 by myself on the 14th. The family has not seen any recent Klonopin prescriptions.  The patient has trouble directly answering all questions today but she does admit to taking 3-5 Klonopin at a time but is unable to provide frequency information. She believes her last dose was yesterday evening. The patient's only complaint today is continued bloating and abdominal pain, son-in-law informs me that a gastric ulcer was in fact found on upper endoscopy on Wednesday I am awaiting reports on this.  Review Of Systems Outlined In  HPI  Past Medical History  Diagnosis Date  . Hypertension   . Anxiety and depression 08/31/2012     Family History  Problem Relation Age of Onset  . Heart attack Mother     father  . Diabetes Mother   . Hypertension Mother   . Stroke       History  Substance Use Topics  . Smoking status: Never Smoker   . Smokeless tobacco: Not on file  . Alcohol Use: Yes     Comment: occasional     Objective: Filed Vitals:   12/17/12 1604  BP: 132/84  Pulse: 74    General: Oriented to place, disoriented to month/date/day No Acute Distress HEENT: Pupils equal, round, reactive to light. Conjunctivae clear.  Moist mucous membranes, pharynx without inflammation nor lesions.  Neck supple without palpable lymphadenopathy nor abnormal masses. Lungs: Clear to auscultation bilaterally, no wheezing/ronchi/rales.  Comfortable work of breathing. Good air movement. Cardiac: Regular rate and rhythm. Normal S1/S2.  No murmurs, rubs, nor gallops.   Abdomen: Soft mild epigastric pain without guarding Extremities: No peripheral edema.  Strong peripheral pulses.  Mental Status: Tangential thought process, pleasant, answers majority of questions inappropriately, confused Skin: Warm and dry.  Assessment & Plan: Mechell was seen today for discuss anxiety.  Diagnoses and associated orders for this visit:  Gastric ulcer, acute  Hallucination  Delusion  Altered mental status    Discussed with family and patient that I believe her hallucinations and  delusions could be from inappropriate consumption of hydrocodone and/or clonazepam or even benzodiazepine withdrawal. Mentally patient is significantly off from her baseline appearing confused and I am quite surprised that she is not defensive and angry with the conversation taking place in the room today.  Joint decision was made that she is currently at high risk of harm herself or others unintentionally due to her altered mental status and that she needs  transfer to a higher level of care, I've encouraged the family to take her to [facility] long emergency room since it is likely she will need psychiatric admission.  Instructed the family that I will follow blood work that was obtained today prior to this visit and we'll notify them when this is available.  40 minutes spent face-to-face during visit today of which at least 50% was counseling or coordinating care regarding delusion, hallucinations, altered mental status, gastric ulcer.   Return for After discharge.

## 2012-12-17 NOTE — ED Notes (Signed)
Pt transferred from triage, presents for medical clearance, after pt reports she is seeing family members that aren't there.  Denies SI or HI.  Pt very pleasant, calm & cooperative.  Pending Telepsych consult.
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
## 2012-12-17 NOTE — Telephone Encounter (Signed)
Pt came into the office thinking she had an appt at 1 today.  She told me this morning that he has had the upper endo.  I printed the lab slip for her & told her to go to the lab & we will call her with the results and go from there.

## 2012-12-17 NOTE — ED Notes (Signed)
Dr Ivan Anchors saw pt and wrote this note. He did not feel pt was safe being at home  Pt " is brought in due to concerns of Decline in mental health since Wednesday of this week. The family reports she is having visual hallucinations at home and out in public for example she believes her daughter has been with her at home despite the daughter being at work all day, she was perceiving a magazine in our waiting room was a Development worker, international aid. She also seems to be in a confused state: She tells me she is hosting a Civil Service fast streamer which is not true, she arrived at our clinic today earlier today for an appointment that did not exist (but did prompt today's later vist), she believes that she has an MRI scheduled for her stomach earlier this week which is not true, she has told me that her former significant other was visiting her back in June however her daughter tells me this is impossible due to his transportation issues, earlier this month the patient told me she had lost her bottle of Klonopin at a hotel room at the beach however the family informs me there was no beach trip. While reviewing medications with the family the daughter states she has never seen a citalopram prescription, they confirm she is taking dexilant, they found an empty bottle of hydrocodone and are unsure how she has been taking this she was prescribed 30 by myself on the 14th. The family has not seen any recent Klonopin prescriptions. The patient has trouble directly answering all questions today but she does admit to taking 3-5 Klonopin at a time but is unable to provide frequency information. She believes her last dose was yesterday evening. The patient's only complaint today is continued bloating and abdominal pain, son-in-law informs me that a gastric ulcer was in fact found on upper endoscopy on Wednesday."  At present pt is very spastic, flight of ideas, and easily distracted. Pt reports abdominal pain 8/10.

## 2012-12-17 NOTE — Telephone Encounter (Signed)
Pt calls & states that she feels so swollen that she "could bust". She states that she looks 3-4 months pregnant & that her legs & feet are swollen too.  Also she wants to know if you've received the report from the GI dr you sent her to.

## 2012-12-17 NOTE — ED Provider Notes (Signed)
History    This chart was scribed for non-physician practitioner Sharilyn Sites, PA working with Candyce Churn, MD by Quintella Reichert, ED Scribe. This patient was seen in room [facility] and the patient's care was started at 6:06 PM.   CSN: [id number]  Arrival date & time 12/17/12  1722    Chief Complaint  Patient presents with  . Medical Clearance    The history is provided by the patient. No language interpreter was used.    Level 5 Caveat: Altered Mental Status   HPI Comments: Sarah Mclaughlin is a 57 y.o. female with h/o anxiety and depression who presents to the Emergency Department complaining of a progressively-worsening mental health problem that began yesterday.  Pt's relative reports that symptoms began yesterday when she was "funny," but that today she has been "out of her mind" and has been persistently experiencing auditory and visual hallucinations and expressing delusional thoughts.  He notes that at baseline pt is coherent and has normal cognition and behavior.  Pt was seen by her PCP today and was found that pt has not been taking her Klonopin for an unknown period of time and there was an empty bottle of hydrocodone. Family members do not normally monitor her medications and do not know how many pills were were in the hydrocodone bottle or how many pills she has taken recently. PCP encouraged evaluated at the local ED for possible withdrawal of klonopin and/or potential OD.  Her relative also notes that she received propofol as anesthesia 2 days ago during upper GI endoscopy procedure-- unknown if this exacerbated current altered state.  Pt additionally complains of moderate-to-severe lower abdominal pain that began 2 weeks ago, described as a bloating sensation-- family states she has complained about this for months. Family denies h/o stroke.  Pt states she drinks alcohol "once in a blue moon" and denies recent illicit drug abuse.   Past Medical History  Diagnosis  Date  . Hypertension   . Anxiety and depression 08/31/2012  . Gastric ulcer     Past Surgical History  Procedure Laterality Date  . Uterus ruptured  1998    Family History  Problem Relation Age of Onset  . Heart attack Mother     father  . Diabetes Mother   . Hypertension Mother   . Stroke      History  Substance Use Topics  . Smoking status: Never Smoker   . Smokeless tobacco: Not on file  . Alcohol Use: Yes     Comment: occasional    OB History   Grav Para Term Preterm Abortions TAB SAB Ect Mult Living                   Review of Systems  Unable to perform ROS: Mental status change      Allergies  Review of patient's allergies indicates no known allergies.  Home Medications   Current Outpatient Rx  Name  Route  Sig  Dispense  Refill  . citalopram (CELEXA) 20 MG tablet      One by mouth daily for the first four days then two by mouth daily.   60 tablet   1   . clonazePAM (KLONOPIN) 0.5 MG tablet      TAKE 1 TABLET 3 TIMES A DAY AS NEEDED ANXIETY   90 tablet   0   . dexlansoprazole (DEXILANT) 60 MG capsule   Oral   Take 1 capsule (60 mg total) by mouth daily.   15  capsule   0   . HYDROcodone-acetaminophen (NORCO/VICODIN) 5-325 MG per tablet   Oral   Take 1 tablet by mouth every 6 (six) hours as needed for pain.   30 tablet   0   . lisinopril-hydrochlorothiazide (PRINZIDE,ZESTORETIC) 20-25 MG per tablet   Oral   Take 0.5 tablets by mouth daily.   90 tablet   3    BP 138/89  Pulse 65  Temp(Src) 98.7 F (37.1 C) (Oral)  Resp 16  SpO2 98%  Physical Exam  Nursing note and vitals reviewed. Constitutional: She appears well-developed and well-nourished. No distress.  HENT:  Head: Normocephalic and atraumatic.  Mouth/Throat: Oropharynx is clear and moist.  Eyes: Conjunctivae and EOM are normal. Pupils are equal, round, and reactive to light.  Neck: Normal range of motion. Neck supple.  Cardiovascular: Normal rate, regular rhythm and  normal heart sounds.   Pulmonary/Chest: Effort normal and breath sounds normal. No respiratory distress.  Abdominal: Soft. Bowel sounds are normal. There is no tenderness. There is no guarding.  Musculoskeletal: Normal range of motion. She exhibits no edema.  Neurological: She is alert. She has normal strength. She displays no tremor. No cranial nerve deficit or sensory deficit. She displays no seizure activity. Gait normal.  CN grossly intact, moves all extremities appropriately without ataxia, no focal neuro deficits or facial droop appreciated  Skin: Skin is warm and dry. She is not diaphoretic.  Psychiatric: Her speech is tangential. She is actively hallucinating. Thought content is delusional. She expresses no homicidal and no suicidal ideation. She expresses no suicidal plans and no homicidal plans.  Tangential speech requiring redirection several times, auditory and visual hallucinations, delusional thoughts, denies SI/HI    ED Course  Procedures (including critical care time)  DIAGNOSTIC STUDIES: Oxygen Saturation is 98% on room air, normal by my interpretation.    COORDINATION OF CARE: 6:13 PM-Discussed treatment plan which includes head CT and evaluation by behavioral health specialist with pt and family at bedside and they agreed to plan.     Labs Reviewed  CBC - Abnormal; Notable for the following:    RBC 2.56 (*)    Hemoglobin 8.1 (*)    HCT 25.0 (*)    All other components within normal limits  COMPREHENSIVE METABOLIC PANEL - Abnormal; Notable for the following:    Glucose, Bld 105 (*)    Creatinine, Ser 1.21 (*)    Albumin 2.9 (*)    Total Bilirubin 0.1 (*)    GFR calc non Af Amer 49 (*)    GFR calc Af Amer 56 (*)    All other components within normal limits  SALICYLATE LEVEL - Abnormal; Notable for the following:    Salicylate Lvl <2.0 (*)    All other components within normal limits  URINE RAPID DRUG SCREEN (HOSP PERFORMED) - Abnormal; Notable for the  following:    Opiates POSITIVE (*)    All other components within normal limits  ACETAMINOPHEN LEVEL  ETHANOL  LIPASE, BLOOD  URINALYSIS, ROUTINE W REFLEX MICROSCOPIC    Ct Head Wo Contrast  12/17/2012   *RADIOLOGY REPORT*  Clinical Data: Altered mental status.  Auditory and visual hallucinations.  CT HEAD WITHOUT CONTRAST  Technique:  Contiguous axial images were obtained from the base of the skull through the vertex without contrast.  Comparison: None.  Findings: The brain is normal in appearance without infarct, hemorrhage, mass lesion, mass effect, midline shift or abnormal extra-axial fluid collection.  No hydrocephalus or pneumocephalus. The calvarium  is intact.  Imaged paranasal sinuses mastoid air cells are clear.  IMPRESSION: Negative head CT.   Original Report Authenticated By: Holley Dexter, M.D.    No diagnosis found.   MDM   CT head negative.  Labs as above.  UDS + for opoids-- unknown consumption.  Auditory and visual hallucinations still present but are somewhat improving.  Pt medically cleared.  Moved to psych ED, telepsych pending.  Temp holding orders and home meds placed-- current psych meds held until telepsych completed due to unknown recent ingestion/possible overdose and/or interactions.  I personally performed the services described in this documentation, which was scribed in my presence. The recorded information has been reviewed and is accurate.    Garlon Hatchet, PA-C 12/18/12 0116  Garlon Hatchet, PA-C 12/18/12 [phone]

## 2012-12-17 NOTE — Telephone Encounter (Signed)
Sue Lush, Can you please ask Markayla if they did the upper endoscopy yet, I've only received a report from her initial visit with the GI doctor.  For some reason her labs from the 14th are not showing up, i'm wondering if she forgot to stop by the lab, if she did give a blood sample can you see if this is unresulted in epic?  There are medications that might help with the swelling but I would want to see labs first.

## 2012-12-17 NOTE — ED Notes (Signed)
Pt belongings given to family. 

## 2012-12-18 ENCOUNTER — Encounter (HOSPITAL_COMMUNITY): Payer: Self-pay | Admitting: *Deleted

## 2012-12-18 ENCOUNTER — Inpatient Hospital Stay (HOSPITAL_COMMUNITY)
Admission: AD | Admit: 2012-12-18 | Discharge: 2012-12-23 | DRG: 885 | Disposition: A | Discharge status: Home / Self Care | Payer: 59 | Source: Ambulatory Visit | Attending: Psychiatry | Admitting: Psychiatry

## 2012-12-18 DIAGNOSIS — N289 Disorder of kidney and ureter, unspecified: Secondary | ICD-10-CM

## 2012-12-18 DIAGNOSIS — F323 Major depressive disorder, single episode, severe with psychotic features: Principal | ICD-10-CM | POA: Diagnosis present

## 2012-12-18 DIAGNOSIS — K253 Acute gastric ulcer without hemorrhage or perforation: Secondary | ICD-10-CM

## 2012-12-18 DIAGNOSIS — F333 Major depressive disorder, recurrent, severe with psychotic symptoms: Secondary | ICD-10-CM

## 2012-12-18 DIAGNOSIS — Z79899 Other long term (current) drug therapy: Secondary | ICD-10-CM

## 2012-12-18 DIAGNOSIS — F411 Generalized anxiety disorder: Secondary | ICD-10-CM | POA: Diagnosis present

## 2012-12-18 DIAGNOSIS — K259 Gastric ulcer, unspecified as acute or chronic, without hemorrhage or perforation: Secondary | ICD-10-CM

## 2012-12-18 DIAGNOSIS — F419 Anxiety disorder, unspecified: Secondary | ICD-10-CM | POA: Diagnosis present

## 2012-12-18 DIAGNOSIS — F32A Depression, unspecified: Secondary | ICD-10-CM | POA: Diagnosis present

## 2012-12-18 DIAGNOSIS — F29 Unspecified psychosis not due to a substance or known physiological condition: Secondary | ICD-10-CM | POA: Diagnosis present

## 2012-12-18 DIAGNOSIS — D62 Acute posthemorrhagic anemia: Secondary | ICD-10-CM | POA: Diagnosis present

## 2012-12-18 DIAGNOSIS — F1999 Other psychoactive substance use, unspecified with unspecified psychoactive substance-induced disorder: Secondary | ICD-10-CM

## 2012-12-18 DIAGNOSIS — R4182 Altered mental status, unspecified: Secondary | ICD-10-CM

## 2012-12-18 DIAGNOSIS — R45851 Suicidal ideations: Secondary | ICD-10-CM

## 2012-12-18 DIAGNOSIS — F329 Major depressive disorder, single episode, unspecified: Secondary | ICD-10-CM | POA: Diagnosis present

## 2012-12-18 DIAGNOSIS — I10 Essential (primary) hypertension: Secondary | ICD-10-CM

## 2012-12-18 DIAGNOSIS — F1320 Sedative, hypnotic or anxiolytic dependence, uncomplicated: Secondary | ICD-10-CM

## 2012-12-18 DIAGNOSIS — R443 Hallucinations, unspecified: Secondary | ICD-10-CM

## 2012-12-18 DIAGNOSIS — F1120 Opioid dependence, uncomplicated: Secondary | ICD-10-CM | POA: Diagnosis present

## 2012-12-18 DIAGNOSIS — F1110 Opioid abuse, uncomplicated: Secondary | ICD-10-CM | POA: Diagnosis present

## 2012-12-18 LAB — CBC WITH DIFFERENTIAL/PLATELET
Eosinophils Absolute: 0.2 10*3/uL (ref 0.0–0.7)
Hemoglobin: 9.2 g/dL — ABNORMAL LOW (ref 12.0–15.0)
Lymphocytes Relative: 17 % (ref 12–46)
Lymphs Abs: 1.2 10*3/uL (ref 0.7–4.0)
MCH: 31.7 pg (ref 26.0–34.0)
MCV: 95.5 fL (ref 78.0–100.0)
Monocytes Relative: 7 % (ref 3–12)
Neutrophils Relative %: 73 % (ref 43–77)
Platelets: 421 10*3/uL — ABNORMAL HIGH (ref 150–400)
RBC: 2.9 MIL/uL — ABNORMAL LOW (ref 3.87–5.11)
WBC: 7 10*3/uL (ref 4.0–10.5)

## 2012-12-18 LAB — COMPLETE METABOLIC PANEL WITH GFR
AST: 24 U/L (ref 0–37)
Alkaline Phosphatase: 89 U/L (ref 39–117)
BUN: 24 mg/dL — ABNORMAL HIGH (ref 6–23)
Creat: 1.23 mg/dL — ABNORMAL HIGH (ref 0.50–1.10)
GFR, Est Non African American: 49 mL/min — ABNORMAL LOW
Glucose, Bld: 110 mg/dL — ABNORMAL HIGH (ref 70–99)

## 2012-12-18 LAB — BASIC METABOLIC PANEL
BUN: 18 mg/dL (ref 6–23)
CO2: 25 mEq/L (ref 19–32)
GFR calc non Af Amer: 49 mL/min — ABNORMAL LOW (ref >90)
Glucose, Bld: 124 mg/dL — ABNORMAL HIGH (ref 70–99)
Potassium: 3.9 mEq/L (ref 3.5–5.1)
Sodium: 136 mEq/L (ref 135–145)

## 2012-12-18 LAB — CBC
HCT: 24.7 % — ABNORMAL LOW (ref 36.0–46.0)
MCV: 97.6 fL (ref 78.0–100.0)
Platelets: 386 10*3/uL (ref 150–400)
RBC: 2.53 MIL/uL — ABNORMAL LOW (ref 3.87–5.11)
WBC: 6.9 10*3/uL (ref 4.0–10.5)

## 2012-12-18 MED ORDER — ALUM & MAG HYDROXIDE-SIMETH 200-200-20 MG/5ML PO SUSP: 30.0000 mL | ORAL | Status: DC | PRN

## 2012-12-18 MED ORDER — LISINOPRIL 10 MG PO TABS
10.0000 mg | ORAL_TABLET | Freq: Every day | ORAL | Status: DC
  Administered 2012-12-19: 10 mg via ORAL
  Filled 2012-12-18 (×3): qty 1

## 2012-12-18 MED ORDER — PANTOPRAZOLE SODIUM 20 MG PO TBEC
20.0000 mg | DELAYED_RELEASE_TABLET | Freq: Every day | ORAL | Status: DC
  Administered 2012-12-18: 20 mg via ORAL
  Filled 2012-12-18: qty 1

## 2012-12-18 MED ORDER — HYDROCHLOROTHIAZIDE 12.5 MG PO CAPS
12.5000 mg | ORAL_CAPSULE | Freq: Every day | ORAL | Status: DC
  Administered 2012-12-19: 12.5 mg via ORAL
  Filled 2012-12-18 (×3): qty 1

## 2012-12-18 MED ORDER — ACETAMINOPHEN 325 MG PO TABS
650.0000 mg | ORAL_TABLET | Freq: Four times a day (QID) | ORAL | Status: DC | PRN
  Administered 2012-12-23: 650 mg via ORAL

## 2012-12-18 MED ORDER — NICOTINE 21 MG/24HR TD PT24: 21.0000 mg | MEDICATED_PATCH | Freq: Every day | TRANSDERMAL | Status: DC | PRN

## 2012-12-18 MED ORDER — ZOLPIDEM TARTRATE 5 MG PO TABS: 5.0000 mg | ORAL_TABLET | Freq: Every evening | ORAL | Status: DC | PRN

## 2012-12-18 MED ORDER — MAGNESIUM HYDROXIDE 400 MG/5ML PO SUSP: 30.0000 mL | Freq: Every day | ORAL | Status: DC | PRN

## 2012-12-18 MED ORDER — LORAZEPAM 1 MG PO TABS
1.0000 mg | ORAL_TABLET | Freq: Three times a day (TID) | ORAL | Status: DC | PRN
  Administered 2012-12-19: 1 mg via ORAL
  Filled 2012-12-18: qty 1

## 2012-12-18 MED ORDER — PANTOPRAZOLE SODIUM 40 MG PO TBEC
40.0000 mg | DELAYED_RELEASE_TABLET | Freq: Two times a day (BID) | ORAL | Status: DC
  Administered 2012-12-18 – 2012-12-23 (×10): 40 mg via ORAL
  Filled 2012-12-18 (×14): qty 1

## 2012-12-18 MED ORDER — RISPERIDONE 1 MG PO TABS
1.0000 mg | ORAL_TABLET | Freq: Every day | ORAL | Status: DC
  Administered 2012-12-18 – 2012-12-20 (×3): 1 mg via ORAL
  Filled 2012-12-18 (×5): qty 1

## 2012-12-18 NOTE — ED Notes (Signed)
Dr arfeen and Josephine NP into see 

## 2012-12-18 NOTE — ED Notes (Signed)
Transfer to [facility] delayed pending repeat labs per Josphine NP

## 2012-12-18 NOTE — ED Notes (Signed)
Up to the bathroom 

## 2012-12-18 NOTE — ED Notes (Signed)
Transport to [facility] in 20 mins per [employer]

## 2012-12-18 NOTE — ED Notes (Signed)
Waiting for security to transport
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
## 2012-12-18 NOTE — ED Notes (Signed)
sleeping
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
## 2012-12-18 NOTE — ED Provider Notes (Signed)
Is a signout from PA Spelter at change: Sarah Mclaughlin is a 57 y.o. female brought in with altered mental status and visual hallucinations worsening over the course of the last 3 days. Patient's chronic Clonopin hydrocodone, patient HCT 2 days ago and was propofol time. Plan is to  followup with psychiatry evaluation.  Telemetry psych recommendations are for inpatient hospitalization. He was working on placement at this time.  [name], PA-C 12/18/12 2203

## 2012-12-18 NOTE — ED Notes (Signed)
Up to the desk requesting something for her HA and nerves.  May give ativan now Murvin Natal NP

## 2012-12-18 NOTE — ED Notes (Signed)
Patients daughter contacted and is aware that the patient is being admitted and will need to contact her after she arrives with further information

## 2012-12-18 NOTE — ED Notes (Signed)
Waiting for disposition to transport to [facility]

## 2012-12-18 NOTE — ED Notes (Signed)
Daughter notified of delay in transport.  She also reports that the pt has a past hx of abusing prescription drugs

## 2012-12-18 NOTE — ED Notes (Signed)
Pt's daughter here ot see, OK to transport Pt to [facility] per Julieanne Cotton NP

## 2012-12-18 NOTE — Progress Notes (Signed)
Patient ID: Sarah Mclaughlin, female   DOB: 03/29/1956, 57 y.o.   MRN: [id number] ADDENDUM:  Discussed with Dr Lolly Mustache patient's most recent CBC result .  H&H is now 9.2 and 27.7 respectively.  Patient is cleared to go to [facility] and we will continue to monitor her blood count by daily H&H lab work.  Patient is also placed on Protonix bid. Sarah Mclaughlin PMHNP-BC  I have personally seen the patient and agreed with the findings and involved in the treatment plan. Kathryne Sharper, MD

## 2012-12-18 NOTE — Progress Notes (Signed)
[facility] Group Notes:  (Nursing/MHT/Case Management/Adjunct)  Date:  12/18/2012  Time:  2000  Type of Therapy:  Psychoeducational Skills  Participation Level:  Active  Participation Quality:  Attentive  Affect:  Depressed  Cognitive:  Appropriate  Insight:  Good  Engagement in Group:  Engaged  Modes of Intervention:  Education  Summary of Progress/Problems: The patient verbalized that she had a quiet day. She spoke of having a bad experience in the emergency department. Her goal for tomorrow is to relax and to have her medication straightened out so that "I can feel better".   Hazle Coca S 12/18/2012, 9:21 PM

## 2012-12-18 NOTE — ED Notes (Signed)
Pt's daughter contacted and is aware that the pt will be transporting shortly.  She also relayed that they are concerned that the patient may be withdrawing from klonipine, and that she also has been using vicodin.  They are concerned that that may have been causing the hallucinations and confusion.

## 2012-12-18 NOTE — [facility] Counselor (Signed)
Patient accepted to [facility] by Dr. Lolly Mustache and Julieanne Cotton, NP to Dr. Elsie Saas. The room # is 402-1. Nursing report # is [phone].

## 2012-12-19 ENCOUNTER — Encounter (HOSPITAL_COMMUNITY): Payer: Self-pay | Admitting: Psychiatry

## 2012-12-19 DIAGNOSIS — F29 Unspecified psychosis not due to a substance or known physiological condition: Secondary | ICD-10-CM

## 2012-12-19 DIAGNOSIS — I10 Essential (primary) hypertension: Secondary | ICD-10-CM

## 2012-12-19 DIAGNOSIS — F1110 Opioid abuse, uncomplicated: Secondary | ICD-10-CM | POA: Diagnosis present

## 2012-12-19 DIAGNOSIS — F1320 Sedative, hypnotic or anxiolytic dependence, uncomplicated: Secondary | ICD-10-CM

## 2012-12-19 DIAGNOSIS — F1994 Other psychoactive substance use, unspecified with psychoactive substance-induced mood disorder: Secondary | ICD-10-CM

## 2012-12-19 DIAGNOSIS — F319 Bipolar disorder, unspecified: Secondary | ICD-10-CM

## 2012-12-19 DIAGNOSIS — K259 Gastric ulcer, unspecified as acute or chronic, without hemorrhage or perforation: Secondary | ICD-10-CM

## 2012-12-19 DIAGNOSIS — D62 Acute posthemorrhagic anemia: Secondary | ICD-10-CM

## 2012-12-19 DIAGNOSIS — N289 Disorder of kidney and ureter, unspecified: Secondary | ICD-10-CM

## 2012-12-19 MED ORDER — CLONIDINE HCL 0.1 MG PO TABS: 0.1000 mg | ORAL_TABLET | Freq: Every day | ORAL | Status: DC

## 2012-12-19 MED ORDER — CHLORDIAZEPOXIDE HCL 25 MG PO CAPS
25.0000 mg | ORAL_CAPSULE | Freq: Four times a day (QID) | ORAL | Status: AC
  Administered 2012-12-19 – 2012-12-20 (×6): 25 mg via ORAL
  Filled 2012-12-19 (×6): qty 1

## 2012-12-19 MED ORDER — CHLORDIAZEPOXIDE HCL 25 MG PO CAPS: 25.0000 mg | ORAL_CAPSULE | Freq: Four times a day (QID) | ORAL | Status: AC | PRN

## 2012-12-19 MED ORDER — MICONAZOLE NITRATE 2 % EX CREA
TOPICAL_CREAM | Freq: Two times a day (BID) | CUTANEOUS | Status: DC
  Administered 2012-12-19 – 2012-12-20 (×2): via TOPICAL
  Filled 2012-12-19: qty 28
  Filled 2012-12-19 (×2): qty 14

## 2012-12-19 MED ORDER — CHLORDIAZEPOXIDE HCL 25 MG PO CAPS: 25.0000 mg | ORAL_CAPSULE | Freq: Every day | ORAL | Status: DC

## 2012-12-19 MED ORDER — DICYCLOMINE HCL 20 MG PO TABS: 20.0000 mg | ORAL_TABLET | Freq: Four times a day (QID) | ORAL | Status: DC | PRN

## 2012-12-19 MED ORDER — CHLORDIAZEPOXIDE HCL 25 MG PO CAPS: 25.0000 mg | ORAL_CAPSULE | ORAL | Status: DC

## 2012-12-19 MED ORDER — HYDROXYZINE HCL 25 MG PO TABS: 25.0000 mg | ORAL_TABLET | Freq: Four times a day (QID) | ORAL | Status: DC | PRN

## 2012-12-19 MED ORDER — NAPROXEN 500 MG PO TABS: 500.0000 mg | ORAL_TABLET | Freq: Two times a day (BID) | ORAL | Status: DC | PRN

## 2012-12-19 MED ORDER — CHLORDIAZEPOXIDE HCL 25 MG PO CAPS
25.0000 mg | ORAL_CAPSULE | Freq: Once | ORAL | Status: AC
  Administered 2012-12-19: 25 mg via ORAL
  Filled 2012-12-19: qty 1

## 2012-12-19 MED ORDER — SUCRALFATE 1 GM/10ML PO SUSP
1.0000 g | Freq: Three times a day (TID) | ORAL | Status: DC
  Administered 2012-12-19 – 2012-12-23 (×15): 1 g via ORAL
  Filled 2012-12-19 (×22): qty 10

## 2012-12-19 MED ORDER — LOPERAMIDE HCL 2 MG PO CAPS: 2.0000 mg | ORAL_CAPSULE | ORAL | Status: DC | PRN

## 2012-12-19 MED ORDER — ONDANSETRON 4 MG PO TBDP: 4.0000 mg | ORAL_TABLET | Freq: Four times a day (QID) | ORAL | Status: DC | PRN

## 2012-12-19 MED ORDER — VITAMIN B-1 100 MG PO TABS
100.0000 mg | ORAL_TABLET | Freq: Every day | ORAL | Status: DC
  Administered 2012-12-20 – 2012-12-23 (×4): 100 mg via ORAL
  Filled 2012-12-19 (×5): qty 1

## 2012-12-19 MED ORDER — CLONIDINE HCL 0.1 MG PO TABS
0.1000 mg | ORAL_TABLET | ORAL | Status: DC
  Filled 2012-12-19 (×2): qty 1

## 2012-12-19 MED ORDER — CLONIDINE HCL 0.1 MG PO TABS
0.1000 mg | ORAL_TABLET | Freq: Four times a day (QID) | ORAL | Status: DC
  Administered 2012-12-19 – 2012-12-21 (×3): 0.1 mg via ORAL
  Filled 2012-12-19 (×9): qty 1

## 2012-12-19 MED ORDER — ADULT MULTIVITAMIN W/MINERALS CH
1.0000 | ORAL_TABLET | Freq: Every day | ORAL | Status: DC
  Administered 2012-12-19 – 2012-12-23 (×5): 1 via ORAL
  Filled 2012-12-19 (×6): qty 1

## 2012-12-19 MED ORDER — CHLORDIAZEPOXIDE HCL 25 MG PO CAPS
25.0000 mg | ORAL_CAPSULE | Freq: Three times a day (TID) | ORAL | Status: DC
  Administered 2012-12-21: 25 mg via ORAL
  Filled 2012-12-19: qty 1

## 2012-12-19 MED ORDER — TRAMADOL HCL 50 MG PO TABS
50.0000 mg | ORAL_TABLET | Freq: Four times a day (QID) | ORAL | Status: DC | PRN
Start: 2012-12-19 — End: 2012-12-23
  Administered 2012-12-19 – 2012-12-23 (×8): 50 mg via ORAL
  Filled 2012-12-19 (×9): qty 1

## 2012-12-19 MED ORDER — METHOCARBAMOL 500 MG PO TABS: 500.0000 mg | ORAL_TABLET | Freq: Three times a day (TID) | ORAL | Status: DC | PRN

## 2012-12-19 MED ORDER — THIAMINE HCL 100 MG/ML IJ SOLN: 100.0000 mg | Freq: Once | INTRAMUSCULAR | Status: DC

## 2012-12-19 MED ORDER — HYDROXYZINE HCL 25 MG PO TABS
25.0000 mg | ORAL_TABLET | Freq: Four times a day (QID) | ORAL | Status: DC | PRN
  Administered 2012-12-21 – 2012-12-22 (×3): 25 mg via ORAL

## 2012-12-19 NOTE — Progress Notes (Signed)
D   Pt is hyperverbal and anxious this morning   She requested something for her nerves and then an hour and a half later she requested another medication   She has been pleasant and cooperative  Her interactions with others are appropriate  She does not appear to be responding to internal stimuli A   Verbal support given   Medications administered and effectiveness monitored   Q 15 min checks R   Pt safe at present

## 2012-12-19 NOTE — Progress Notes (Signed)
[facility] Group Notes:  (Nursing/MHT/Case Management/Adjunct)  Date:  12/19/2012  Time:  2000  Type of Therapy:  Psychoeducational Skills  Participation Level:  Active  Participation Quality:  Appropriate  Affect:  Appropriate  Cognitive:  Appropriate  Insight:  Good  Engagement in Group:  Engaged  Modes of Intervention:  Education  Summary of Progress/Problems: The patient shared with the group that she had a "productive day".  She stated that she had a good family visit this evening and that she had a good talk with the physicians assistant. Her support group includes her relatives. Her goal for tomorrow is to speak with the doctor regarding her medication.   [name] S 12/19/2012, 10:10 PM

## 2012-12-19 NOTE — Consult Note (Signed)
Curbside consultation After discussing over the phone concerns about (soft BP, hx of gastric ulcer and low Hgb) from [facility], PA-C. We got to the conclusions and plan: -Hgb of 9.2 (higher than previous days), no elevated BUN and not active hx of bleeding at this point, might not require transfusion or further intervention currently. Recommend to be vigilant  of Hgb trend, and if concerns for melena the she will need to have a FOBT check. -will stop naproxen (due to hx of gastric ulcer and concerns for bleeding); use tylenol or tramadol for pain -will adjust BP medications (discontinue HCTZ-lisinopril) especially while using clonidine. BP is already soft and Cr 1.2 -Will start carafate and make sure patient is eating and drinking properly -repeat labs in am, especially to follow BUN/Cr and Hgb levels -if symptoms failed to improve and/or patient condition deteriorates, she will need to be admitted to the hospital for further evaluation and treatement.  Sarah Mclaughlin [phone]
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
## 2012-12-19 NOTE — Progress Notes (Signed)
Psychoeducational Group Note  Date:  12/19/2012 Time:  0945 am  Group Topic/Focus:  Making Healthy Choices:   The focus of this group is to help patients identify negative/unhealthy choices they were using prior to admission and identify positive/healthier coping strategies to replace them upon discharge.  Participation Level:  Minimal  Participation Quality:  Appropriate  Affect:  Anxious  Cognitive:  Appropriate  Insight:  Limited  Engagement in Group:  Limited  Additional Comments:    Andrena Mews 12/19/2012, 10:29 AM

## 2012-12-19 NOTE — Tx Team (Signed)
Initial Interdisciplinary Treatment Plan  PATIENT STRENGTHS: (choose at least two) Average or above average intelligence Motivation for treatment/growth Supportive family/friends  PATIENT STRESSORS: Financial difficulties Loss of companionship   PROBLEM LIST: Problem List/Patient Goals Date to be addressed Date deferred Reason deferred Estimated date of resolution  Psychosis 12/18/12     Suicidal ideation 12/18/12                                                DISCHARGE CRITERIA:  Improved stabilization in mood, thinking, and/or behavior Need for constant or close observation no longer present Verbal commitment to aftercare and medication compliance  PRELIMINARY DISCHARGE PLAN: Outpatient therapy Return to previous living arrangement Return to previous work or school arrangements  PATIENT/FAMIILY INVOLVEMENT: This treatment plan has been presented to and reviewed with the patient, Sarah Mclaughlin, Sarah Mclaughlin 12/19/2012, 1:59 AM
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
## 2012-12-19 NOTE — Progress Notes (Signed)
Occult blood stool sample was collected and put in the frig for lab to pick up   Sample was collected by RN via internal exam and placed on card  Pt tolerated the procedure well

## 2012-12-19 NOTE — BHH Group Notes (Signed)
B[facility] Group Notes:  (Clinical Social Work)  12/19/2012   11:15am-12:00pm  Summary of Progress/Problems:  The main focus of today's process group was to listen to a variety of genres of music and to identify that different types of music provoke different responses.  The patient then was able to identify personally what was soothing for them, as well as energizing.  Handouts were used to record feelings evoked, as well as how patient can personally use this knowledge in sleep habits, with depression, and with other symptoms.  The patient expressed understanding of concepts, as well as knowledge of how each type of music affected them and how this can be used when they are at home as a tool in their recovery.  Type of Therapy:  Music Therapy   Participation Level:  Active  Participation Quality:  Attentive and Sharing  Affect:  Blunted  Cognitive:  Oriented  Insight:  Engaged  Engagement in Therapy:  Engaged  Modes of Intervention:   Activity, Exploration  Ambrose Mantle, LCSW 12/19/2012, 12:44 PM

## 2012-12-19 NOTE — Progress Notes (Signed)
Patient ID: Sarah Mclaughlin, female   DOB: 07/20/1955, 57 y.o.   MRN: [id number] D. The patient was brighter this evening. She still has an anxious mood and affect. Stated that she had a good day today after talking with her doctor. Reports that her doctor told her that perhaps the combination of the medications she was taking combined with the anesthesia from the endoscopy could have caused her psychotic episode. This made her feel better and more hopeful. A. Met with patient 1:1 to assess. Encouraged to attend evening group. Reviewed new medications and administered. R. Attended and actively participated in evening wrap up group. Compliant with medications. HS Clonidine held due to low blood pressure.

## 2012-12-19 NOTE — ED Provider Notes (Signed)
Medical screening examination/treatment/procedure(s) were performed by non-physician practitioner and as supervising physician I was immediately available for consultation/collaboration.  Jenine Krisher M Jacquette Canales, MD 12/19/12 0617 

## 2012-12-20 ENCOUNTER — Encounter: Payer: Self-pay | Admitting: Family Medicine

## 2012-12-20 DIAGNOSIS — F1910 Other psychoactive substance abuse, uncomplicated: Secondary | ICD-10-CM

## 2012-12-20 LAB — BASIC METABOLIC PANEL
Calcium: 9.3 mg/dL (ref 8.4–10.5)
GFR calc Af Amer: 45 mL/min — ABNORMAL LOW (ref >90)
GFR calc non Af Amer: 38 mL/min — ABNORMAL LOW (ref >90)
Potassium: 3.9 mEq/L (ref 3.5–5.1)
Sodium: 136 mEq/L (ref 135–145)

## 2012-12-20 LAB — CBC
Hemoglobin: 9.8 g/dL — ABNORMAL LOW (ref 12.0–15.0)
MCH: 31.1 pg (ref 26.0–34.0)
Platelets: 520 10*3/uL — ABNORMAL HIGH (ref 150–400)
RBC: 3.15 MIL/uL — ABNORMAL LOW (ref 3.87–5.11)

## 2012-12-20 NOTE — Progress Notes (Signed)
[facility] MD Progress Note  12/20/2012 11:42 AM Sarah Mclaughlin  MRN:  [id number] Subjective:   Patient showing more insight into the circumstances that resulted in hospital admission stating "I was so disoriented. Talking to people who were not there. My daughter thought I was having a stroke. I see now that the medicine used for my endoscopy Thursday did not mix with the klonopin and opiate I was using. I want to find a safe way to manage my anxiety from now on. This episode was really scary. I am feeling more life myself now." Patient asking to know what medicine she was being prescribed and this information was reviewed with her. The patient talks about the increased amounts of stress she has been under and felt like it finally affected her physical and mental health. Patient states "I did not know I was becoming anemic but now I look back and think I was really tired and short of breath a lot."   Diagnosis:   Axis I: Substance Abuse and Substance Induced Mood Disorder Axis II: Deferred Axis III:  Past Medical History  Diagnosis Date  . Hypertension   . Anxiety and depression 08/31/2012  . Gastric ulcer    Axis IV: economic problems, occupational problems, other psychosocial or environmental problems and problems related to social environment Axis V: 41-50 serious symptoms  ADL's:  Intact  Sleep: Fair  Appetite:  Fair  Suicidal Ideation:  Denies Homicidal Ideation:  Denies AEB (as evidenced by):  Psychiatric Specialty Exam: Review of Systems  Constitutional: Positive for malaise/fatigue.  HENT: Negative.   Eyes: Negative.   Respiratory: Negative.   Cardiovascular: Negative.   Gastrointestinal: Positive for abdominal pain (Patient reports must be careful with food selections due to current ulcer. ) and blood in stool.  Genitourinary: Negative.   Musculoskeletal: Negative.   Skin: Negative.   Neurological: Negative.   Endo/Heme/Allergies: Negative.   Psychiatric/Behavioral:  Positive for depression and substance abuse. Negative for suicidal ideas, hallucinations and memory loss. The patient is nervous/anxious and has insomnia.     Blood pressure 88/66, pulse 78, temperature 96.9 F (36.1 C), temperature source Oral, resp. rate 16, height 4' 11.45" (1.51 m), weight 57.607 kg (127 lb).Body mass index is 25.27 kg/(m^2).  General Appearance: Neat and Well Groomed  Patent attorney::  Good  Speech:  Clear and Coherent and Normal Rate  Volume:  Normal  Mood:  Anxious  Affect:  Congruent  Thought Process:  Goal Directed and Intact  Orientation:  Full (Time, Place, and Person)  Thought Content:  Rumination  Suicidal Thoughts:  No  Homicidal Thoughts:  No  Memory:  Immediate;   Good Recent;   Good Remote;   Good  Judgement:  Fair  Insight:  Shallow  Psychomotor Activity:  Restlessness  Concentration:  Fair  Recall:  Good  Akathisia:  No  Handed:  Right  AIMS (if indicated):     Assets:  Communication Skills Desire for Improvement Housing Intimacy Leisure Time Resilience Social Support  Sleep:  Number of Hours: 5.75   Current Medications: Current Facility-Administered Medications  Medication Dose Route Frequency Provider Last Rate Last Dose  . acetaminophen (TYLENOL) tablet 650 mg  650 mg Oral Q6H PRN Earney Navy, NP      . alum & mag hydroxide-simeth (MAALOX/MYLANTA) 200-200-20 MG/5ML suspension 30 mL  30 mL Oral Q4H PRN Earney Navy, NP      . chlordiazePOXIDE (LIBRIUM) capsule 25 mg  25 mg Oral Q6H PRN Verne Spurr,  PA-C      . chlordiazePOXIDE (LIBRIUM) capsule 25 mg  25 mg Oral QID Verne Spurr, PA-C   25 mg at 12/20/12 0820   Followed by  . [START ON 12/21/2012] chlordiazePOXIDE (LIBRIUM) capsule 25 mg  25 mg Oral TID Verne Spurr, PA-C       Followed by  . [START ON 12/22/2012] chlordiazePOXIDE (LIBRIUM) capsule 25 mg  25 mg Oral BH-qamhs Verne Spurr, PA-C       Followed by  . [START ON 12/23/2012] chlordiazePOXIDE (LIBRIUM) capsule  25 mg  25 mg Oral Daily Verne Spurr, PA-C      . cloNIDine (CATAPRES) tablet 0.1 mg  0.1 mg Oral QID Verne Spurr, PA-C   0.1 mg at 12/19/12 1703   Followed by  . [START ON 12/21/2012] cloNIDine (CATAPRES) tablet 0.1 mg  0.1 mg Oral BH-qamhs Verne Spurr, PA-C       Followed by  . [START ON 12/24/2012] cloNIDine (CATAPRES) tablet 0.1 mg  0.1 mg Oral QAC breakfast Verne Spurr, PA-C      . dicyclomine (BENTYL) tablet 20 mg  20 mg Oral Q6H PRN Verne Spurr, PA-C      . hydrOXYzine (ATARAX/VISTARIL) tablet 25 mg  25 mg Oral Q6H PRN Verne Spurr, PA-C      . loperamide (IMODIUM) capsule 2-4 mg  2-4 mg Oral PRN Verne Spurr, PA-C      . magnesium hydroxide (MILK OF MAGNESIA) suspension 30 mL  30 mL Oral Daily PRN Earney Navy, NP      . miconazole (MICOTIN) 2 % cream   Topical BID [name]      . multivitamin with minerals tablet 1 tablet  1 tablet Oral Daily Verne Spurr, PA-C   1 tablet at 12/20/12 0819  . nicotine (NICODERM CQ - dosed in mg/24 hours) patch 21 mg  21 mg Transdermal Daily PRN Earney Navy, NP      . ondansetron (ZOFRAN-ODT) disintegrating tablet 4 mg  4 mg Oral Q6H PRN Verne Spurr, PA-C      . pantoprazole (PROTONIX) EC tablet 40 mg  40 mg Oral BID Earney Navy, NP   40 mg at 12/20/12 0819  . risperiDONE (RISPERDAL) tablet 1 mg  1 mg Oral QHS Earney Navy, NP   1 mg at 12/19/12 2137  . sucralfate (CARAFATE) 1 GM/10ML suspension 1 g  1 g Oral TID WC & HS Vassie Loll, MD   1 g at 12/20/12 0819  . thiamine (B-1) injection 100 mg  100 mg Intramuscular Once [employer], PA-C      . thiamine (VITAMIN B-1) tablet 100 mg  100 mg Oral Daily Verne Spurr, PA-C   100 mg at 12/20/12 0819  . traMADol (ULTRAM) tablet 50 mg  50 mg Oral Q6H PRN Vassie Loll, MD   50 mg at 12/20/12 1610    Lab Results:  Results for orders placed during the hospital encounter of 12/18/12 (from the past 48 hour(s))  BASIC METABOLIC PANEL     Status: Abnormal    Collection Time    12/20/12  6:15 AM      Result Value Range   Sodium 136  135 - 145 mEq/L   Potassium 3.9  3.5 - 5.1 mEq/L   Chloride 101  96 - 112 mEq/L   CO2 26  19 - 32 mEq/L   Glucose, Bld 107 (*) 70 - 99 mg/dL   BUN 30 (*) 6 - 23 mg/dL   Creatinine,  Ser 1.47 (*) 0.50 - 1.10 mg/dL   Calcium 9.3  8.4 - 16.1 mg/dL   GFR calc non Af Amer 38 (*) >90 mL/min   GFR calc Af Amer 45 (*) >90 mL/min   Comment:            The eGFR has been calculated     using the CKD EPI equation.     This calculation has not been     validated in all clinical     situations.     eGFR's persistently     <90 mL/min signify     possible Chronic Kidney Disease.  CBC     Status: Abnormal   Collection Time    12/20/12  6:15 AM      Result Value Range   WBC 6.1  4.0 - 10.5 K/uL   RBC 3.15 (*) 3.87 - 5.11 MIL/uL   Hemoglobin 9.8 (*) 12.0 - 15.0 g/dL   HCT 09.6 (*) 04.5 - 40.9 %   MCV 96.2  78.0 - 100.0 fL   MCH 31.1  26.0 - 34.0 pg   MCHC 32.3  30.0 - 36.0 g/dL   RDW 81.1  91.4 - 78.2 %   Platelets 520 (*) 150 - 400 K/uL    Physical Findings: AIMS: Facial and Oral Movements Muscles of Facial Expression: None, normal Lips and Perioral Area: None, normal Jaw: None, normal Tongue: None, normal,Extremity Movements Upper (arms, wrists, hands, fingers): None, normal Lower (legs, knees, ankles, toes): None, normal, Trunk Movements Neck, shoulders, hips: None, normal, Overall Severity Severity of abnormal movements (highest score from questions above): None, normal Incapacitation due to abnormal movements: None, normal, Dental Status Current problems with teeth and/or dentures?: No Does patient usually wear dentures?: No  CIWA:  CIWA-Ar Total: 0 COWS:     Treatment Plan Summary: Daily contact with patient to assess and evaluate symptoms and progress in treatment Medication management  Plan:  Continue crisis management and stabilization.  Medication management: Continue Librium and Clonidine  detox protocols. Patient responding to Risperdal 1 mg at bedtime.  Encouraged patient to attend groups and participate in group counseling sessions and activities.  Discharge plan in progress.  Address health issues: Monitoring CBC values to follow hemoglobin which is 9.8 today. Blood pressures have been running low since initiation of clonidine protocol. Encourage increased fluid intake.  Continue current treatment plan.   Medical Decision Making Problem Points:  Review of psycho-social stressors (1) Data Points:  Review of medication regiment & side effects (2)  I certify that inpatient services furnished can reasonably be expected to improve the patient's condition.   Darrio Bade NP-C 12/20/2012, 11:42 AM

## 2012-12-20 NOTE — Progress Notes (Signed)
The focus of this group is to help patients review their daily goal of treatment and discuss progress on daily workbooks. Pt attended the evening group session and responded to all discussion prompts from the Writer. Pt reported having a good day, the highlight of which was having her CSW phone her family to discuss her case. Pt shared that she considered her family her only support system in this world and that she was happy to have someone outside her situation (the CSW) speaking with them. Pt reported her goal for this week as beginning and understanding a new medication regimen. Pt's affect was bright.
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
## 2012-12-20 NOTE — [facility] Suicide Risk Assessment (Signed)
[facility] INPATIENT:  Family/Significant Other Suicide Prevention Education  Suicide Prevention Education:  Education Completed; No one has been identified by the patient as the family member/significant other with whom the patient will be residing, and identified as the person(s) who will aid the patient in the event of a mental health crisis (suicidal ideations/suicide attempt).  With written consent from the patient, the family member/significant other has been provided the following suicide prevention education, prior to the and/or following the discharge of the patient.  The suicide prevention education provided includes the following:  Suicide risk factors  Suicide prevention and interventions  National Suicide Hotline telephone number  [facility] assessment telephone number  [facility] Emergency Assistance 911  [facility] and/or Residential Mobile Crisis Unit telephone number  Request made of family/significant other to:  Remove weapons (e.g., guns, rifles, knives), all items previously/currently identified as safety concern.    Remove drugs/medications (over-the-counter, prescriptions, illicit drugs), all items previously/currently identified as a safety concern.  The family member/significant other verbalizes understanding of the suicide prevention education information provided.  The family member/significant other agrees to remove the items of safety concern listed above.  The patient did not endorse SI at the time of admission, nor did the patient c/o SI during the stay here.  SPE not required.   Daryel Gerald B 12/20/2012, 12:30 PM

## 2012-12-20 NOTE — Progress Notes (Signed)
Patient ID: Sarah Mclaughlin, female   DOB: June 12, 1955, 57 y.o.   MRN: [id number] D:Patient presents with anxious mood; flat affect.  Patient thought processes are clear and she denies any SI/HI/AVH.  Patient continues on the librium protocol, with minimal withdrawal.  She does continue to request ativan for anxiety.  Informed patient that the librium with help with her anxiety and withdrawal.  Her main concern is continuing care for her peptic ulcer. Patient's blood pressure continues to be low and has not met parameters for clonidine.  She is attending groups and participating in her care. A: continue to monitor medication management and MD orders.  Safety checks completed every 15 minutes R: patient receptive to staff; her behavior is appropriate.

## 2012-12-20 NOTE — Progress Notes (Signed)
Recreation Therapy Notes  Date: 07.21.2014 Time: 9:30am Location: [address] Dayroom      Group Topic/Focus: Leisure Education  Activity Participation Level: Active  Activity Participation Quality: Appropriate  Group Discussion: What is Leisure and benefit of leisure to whole wellness.   Group Discussion Participation Level: Active   Affect: Euthymic Cognitive: Oriented   Additional Comments: Activity: Leisure ABC's; Explanation: LRT wrote the alphabet on the white board. Patients were asked to state a leisure/recreation activity for each letter of the alphabet in a rotating fashion.   Patient actively patient in group activity and group discussion. Patient stated a benefit of leisure as the ability to concentrate in yourself. Patient listened intently as others volunteered statements. Following activity patient shared that some activities on the list made her feel "stupid." Patient clarified it is because some activities require that ability to read music, which she can not do. Patient verbalized understanding that not all activities are for everyone. Patient successfully stated a leisure/recreation activity for each letter her turn required. Patient offered support to peers that were unable to think of or state a leisure/recreation activity.   Marykay Lex Katherine Syme, LRT/CTRS  Jearl Klinefelter 12/20/2012 12:14 PM
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
## 2012-12-20 NOTE — Tx Team (Signed)
  Interdisciplinary Treatment Plan Update   Date Reviewed:  12/20/2012  Time Reviewed:  8:15 AM  Progress in Treatment:   Attending groups: Yes Participating in groups: Yes Taking medication as prescribed: Yes  Tolerating medication: Yes Family/Significant other contact made: No Patient understands diagnosis: Yes As evidenced by asking for help with panic attacks, feeling stressed Discussing patient identified problems/goals with staff: Yes  See initial plan Medical problems stabilized or resolved: Yes Denies suicidal/homicidal ideation: Yes  In tx team Patient has not harmed self or others: Yes  For review of initial/current patient goals, please see plan of care.  Estimated Length of Stay:  4-5 days  Reason for Continuation of Hospitalization: Anxiety Medication stabilization Withdrawal symptoms  New Problems/Goals identified:  N/A  Discharge Plan or Barriers:   return home, follow up outpt  Additional Comments:  Patient showing more insight into the circumstances that resulted in hospital admission stating "I was so disoriented. Talking to people who were not there. My daughter thought I was having a stroke. I see now that the medicine used for my endoscopy Thursday did not mix with the klonopin and opiate I was using. I want to find a safe way to manage my anxiety from now on. This episode was really scary. I am feeling more life myself now." Patient asking to know what medicine she was being prescribed and this information was reviewed with her. The patient talks about the increased amounts of stress she has been under and felt like it finally affected her physical and mental health   Attendees:  Signature: Thedore Mins, MD 12/20/2012 8:15 AM   Signature: Richelle Ito, LCSW 12/20/2012 8:15 AM  Signature: Verne Spurr, PA 12/20/2012 8:15 AM  Signature: Joslyn Devon, RN 12/20/2012 8:15 AM  Signature: Liborio Nixon, RN 12/20/2012 8:15 AM  Signature:  12/20/2012 8:15 AM   Signature:   12/20/2012 8:15 AM  Signature:    Signature:    Signature:    Signature:    Signature:    Signature:      Scribe for Treatment Team:   Richelle Ito, LCSW  12/20/2012 8:15 AM

## 2012-12-20 NOTE — [facility] Group Notes (Signed)
[facility] LCSW Aftercare Discharge Planning Group Note   12/20/2012 8:15 AM  Participation Quality:  Engaged  Mood/Affect:  Appropriate  Depression Rating:  5  Anxiety Rating:  5  Thoughts of Suicide:  No Will you contract for safety?   NA  Current AVH:  No  Plan for Discharge/Comments:  Sarah Mclaughlin states her daughter and son in law were concerned about her because she was "hallucinating and going crazy."  States she suffers from panic attacks and feels unsafe at home.  "I feel safe here."  Admits that she was taking benzos and "oxys" previous to admission due to recent surgery.  Lives in a duplex purchased for her by her daughter.  Transportation Means:  family  Supports:  family  [name], [name]

## 2012-12-21 DIAGNOSIS — F1310 Sedative, hypnotic or anxiolytic abuse, uncomplicated: Secondary | ICD-10-CM

## 2012-12-21 DIAGNOSIS — F329 Major depressive disorder, single episode, unspecified: Secondary | ICD-10-CM

## 2012-12-21 DIAGNOSIS — F411 Generalized anxiety disorder: Secondary | ICD-10-CM

## 2012-12-21 DIAGNOSIS — F1110 Opioid abuse, uncomplicated: Secondary | ICD-10-CM

## 2012-12-21 MED ORDER — PROPRANOLOL HCL 10 MG PO TABS
10.0000 mg | ORAL_TABLET | Freq: Two times a day (BID) | ORAL | Status: DC
Start: 2012-12-21 — End: 2012-12-23
  Administered 2012-12-21 – 2012-12-23 (×4): 10 mg via ORAL
  Filled 2012-12-21 (×6): qty 1

## 2012-12-21 MED ORDER — HALOPERIDOL 1 MG PO TABS
1.0000 mg | ORAL_TABLET | Freq: Every day | ORAL | Status: DC
  Administered 2012-12-21 – 2012-12-22 (×2): 1 mg via ORAL
  Filled 2012-12-21 (×3): qty 1

## 2012-12-21 MED ORDER — CLONIDINE HCL 0.1 MG PO TABS: 0.1000 mg | ORAL_TABLET | Freq: Two times a day (BID) | ORAL | Status: DC | PRN

## 2012-12-21 MED ORDER — TRAZODONE HCL 50 MG PO TABS
75.0000 mg | ORAL_TABLET | Freq: Every evening | ORAL | Status: DC | PRN
  Administered 2012-12-21: 75 mg via ORAL
  Filled 2012-12-21: qty 2

## 2012-12-21 MED ORDER — SERTRALINE HCL 50 MG PO TABS
50.0000 mg | ORAL_TABLET | Freq: Every day | ORAL | Status: DC
  Administered 2012-12-21 – 2012-12-23 (×3): 50 mg via ORAL
  Filled 2012-12-21 (×4): qty 1

## 2012-12-21 NOTE — Progress Notes (Signed)
Adult Psychoeducational Group Note  Date:  12/21/2012 Time:  10:22 PM  Group Topic/Focus:  Goals Group:   The focus of this group is to help patients establish daily goals to achieve during treatment and discuss how the patient can incorporate goal setting into their daily lives to aide in recovery.  Participation Level:  Active  Participation Quality:  Appropriate  Affect:  Appropriate  Cognitive:  Appropriate  Insight: Appropriate  Engagement in Group:  Engaged  Modes of Intervention:  Discussion  Additional Comments:  Pt. Stated that she met with doctor, meds where changed and it made her feel better. Pt. Also stated that she needs help with placement when she leaves.  Aldona Lento 12/21/2012, 10:22 PM

## 2012-12-21 NOTE — Progress Notes (Signed)
Patient ID: Sarah Mclaughlin, female   DOB: Nov 27, 1955, 57 y.o.   MRN: [id number] [facility] MD Progress Note  12/21/2012 10:49 AM Sarah Mclaughlin  MRN:  [id number] Subjective: "I am still having anxiety and difficulty sleeping." Objective: Patient reports ongoing anxiety, lack of motivation, edginess, fidgety, restlessness, poor energy level, panic attacks, difficulty sleeping, racing thoughts and excessive worries. She denies psychosis and suicidal thoughts. She says that she is willing to do everything to get off opiates and benzodiazepine. Patient is currently on opiates and benzodiazepine protocol. She denies craving for drugs. Our case manager is working on getting patient to continue drug rehabilitation at [facility] upon discharge. Patient  Is willing to try other medications to address her anxiety and depressive symptoms. Diagnosis:   Axis I: Major depressive disorder with psychosis           Generalized anxiety disorder            Opioid abuse. Benzodiazepine abuse. Axis II: Deferred Axis III:  Past Medical History  Diagnosis Date  . Hypertension   . Anxiety and depression 08/31/2012  . Gastric ulcer    Axis IV: economic problems, occupational problems, other psychosocial or environmental problems and problems related to social environment Axis V: 41-50  ADL's:  Intact  Sleep: Fair  Appetite:  Fair  Suicidal Ideation:  Denies Homicidal Ideation:  Denies AEB (as evidenced by):  Psychiatric Specialty Exam: Review of Systems  Constitutional: Positive for malaise/fatigue.  HENT: Negative.   Eyes: Negative.   Respiratory: Negative.   Cardiovascular: Negative.   Gastrointestinal: Positive for abdominal pain (Patient reports must be careful with food selections due to current ulcer. ) and blood in stool.  Genitourinary: Negative.   Musculoskeletal: Negative.   Skin: Negative.   Neurological: Negative.   Endo/Heme/Allergies: Negative.   Psychiatric/Behavioral: Positive for depression  and substance abuse. Negative for suicidal ideas, hallucinations and memory loss. The patient is nervous/anxious and has insomnia.     Blood pressure 96/70, pulse 77, temperature 97.5 F (36.4 C), temperature source Oral, resp. rate 16, height 4' 11.45" (1.51 m), weight 57.607 kg (127 lb).Body mass index is 25.27 kg/(m^2).  General Appearance: Neat and Well Groomed  Patent attorney::  Good  Speech:  Clear and Coherent and Normal Rate  Volume:  Normal  Mood:  Anxious  Affect:  Congruent  Thought Process:  Goal Directed and Intact  Orientation:  Full (Time, Place, and Person)  Thought Content:  Rumination  Suicidal Thoughts:  No  Homicidal Thoughts:  No  Memory:  Immediate;   Good Recent;   Good Remote;   Good  Judgement:  Fair  Insight:  Shallow  Psychomotor Activity:  Restlessness  Concentration:  Fair  Recall:  Good  Akathisia:  No  Handed:  Right  AIMS (if indicated):     Assets:  Communication Skills Desire for Improvement Housing Intimacy Leisure Time Resilience Social Support  Sleep:  Number of Hours: 2.5   Current Medications: Current Facility-Administered Medications  Medication Dose Route Frequency Provider Last Rate Last Dose  . acetaminophen (TYLENOL) tablet 650 mg  650 mg Oral Q6H PRN Earney Navy, NP      . alum & mag hydroxide-simeth (MAALOX/MYLANTA) 200-200-20 MG/5ML suspension 30 mL  30 mL Oral Q4H PRN Earney Navy, NP      . chlordiazePOXIDE (LIBRIUM) capsule 25 mg  25 mg Oral Q6H PRN Verne Spurr, PA-C      . dicyclomine (BENTYL) tablet 20 mg  20 mg Oral Q6H  PRN Verne Spurr, PA-C      . haloperidol (HALDOL) tablet 1 mg  1 mg Oral QHS Daylan Boggess      . hydrOXYzine (ATARAX/VISTARIL) tablet 25 mg  25 mg Oral Q6H PRN Verne Spurr, PA-C   25 mg at 12/21/12 0029  . loperamide (IMODIUM) capsule 2-4 mg  2-4 mg Oral PRN Verne Spurr, PA-C      . magnesium hydroxide (MILK OF MAGNESIA) suspension 30 mL  30 mL Oral Daily PRN Earney Navy, NP       . miconazole (MICOTIN) 2 % cream   Topical BID Ordean Fouts      . multivitamin with minerals tablet 1 tablet  1 tablet Oral Daily Verne Spurr, PA-C   1 tablet at 12/21/12 0740  . nicotine (NICODERM CQ - dosed in mg/24 hours) patch 21 mg  21 mg Transdermal Daily PRN Earney Navy, NP      . ondansetron (ZOFRAN-ODT) disintegrating tablet 4 mg  4 mg Oral Q6H PRN Verne Spurr, PA-C      . pantoprazole (PROTONIX) EC tablet 40 mg  40 mg Oral BID Earney Navy, NP   40 mg at 12/21/12 0740  . propranolol (INDERAL) tablet 10 mg  10 mg Oral BID Sarthak Rubenstein      . sertraline (ZOLOFT) tablet 50 mg  50 mg Oral Daily Emberly Tomasso      . sucralfate (CARAFATE) 1 GM/10ML suspension 1 g  1 g Oral TID WC & HS Vassie Loll, MD   1 g at 12/21/12 0740  . thiamine (B-1) injection 100 mg  100 mg Intramuscular Once [employer], PA-C      . thiamine (VITAMIN B-1) tablet 100 mg  100 mg Oral Daily Verne Spurr, PA-C   100 mg at 12/21/12 0739  . traMADol (ULTRAM) tablet 50 mg  50 mg Oral Q6H PRN Vassie Loll, MD   50 mg at 12/20/12 2356  . traZODone (DESYREL) tablet 75 mg  75 mg Oral QHS PRN Zadok Holaway        Lab Results:  Results for orders placed during the hospital encounter of 12/18/12 (from the past 48 hour(s))  BASIC METABOLIC PANEL     Status: Abnormal   Collection Time    12/20/12  6:15 AM      Result Value Range   Sodium 136  135 - 145 mEq/L   Potassium 3.9  3.5 - 5.1 mEq/L   Chloride 101  96 - 112 mEq/L   CO2 26  19 - 32 mEq/L   Glucose, Bld 107 (*) 70 - 99 mg/dL   BUN 30 (*) 6 - 23 mg/dL   Creatinine, Ser 5.78 (*) 0.50 - 1.10 mg/dL   Calcium 9.3  8.4 - 46.9 mg/dL   GFR calc non Af Amer 38 (*) >90 mL/min   GFR calc Af Amer 45 (*) >90 mL/min   Comment:            The eGFR has been calculated     using the CKD EPI equation.     This calculation has not been     validated in all clinical     situations.     eGFR's persistently     <90 mL/min signify     possible  Chronic Kidney Disease.  CBC     Status: Abnormal   Collection Time    12/20/12  6:15 AM      Result Value Range   WBC 6.1  4.0 - 10.5 K/uL   RBC 3.15 (*) 3.87 - 5.11 MIL/uL   Hemoglobin 9.8 (*) 12.0 - 15.0 g/dL   HCT 16.1 (*) 09.6 - 04.5 %   MCV 96.2  78.0 - 100.0 fL   MCH 31.1  26.0 - 34.0 pg   MCHC 32.3  30.0 - 36.0 g/dL   RDW 40.9  81.1 - 91.4 %   Platelets 520 (*) 150 - 400 K/uL    Physical Findings: AIMS: Facial and Oral Movements Muscles of Facial Expression: None, normal Lips and Perioral Area: None, normal Jaw: None, normal Tongue: None, normal,Extremity Movements Upper (arms, wrists, hands, fingers): None, normal Lower (legs, knees, ankles, toes): None, normal, Trunk Movements Neck, shoulders, hips: None, normal, Overall Severity Severity of abnormal movements (highest score from questions above): None, normal Incapacitation due to abnormal movements: None, normal Patient's awareness of abnormal movements (rate only patient's report): No Awareness, Dental Status Current problems with teeth and/or dentures?: No Does patient usually wear dentures?: No  CIWA:  CIWA-Ar Total: 0 COWS:     Treatment Plan Summary: Daily contact with patient to assess and evaluate symptoms and progress in treatment Medication management  Plan: Continue crisis management and stabilization.  Medication management: Continue Librium and Clonidine detox protocols. Patient responding to Risperdal 1 mg at bedtime.  Encouraged patient to attend groups and participate in group counseling sessions and activities.  Initiate Zoloft 50mg  po daily to address anxiety and depression. Initiate Propranolol 10mg  po BID for panic disorder/elevated blood presseure. Trazodone 75mg  po Qhs PRN Insomnia.  Medical Decision Making Problem Points:  Review of psycho-social stressors (1) Data Points:  Review of medication regiment & side effects (2)  I certify that inpatient services furnished can reasonably be  expected to improve the patient's condition.   Yolani Vo,MD 12/21/2012, 10:49 AM

## 2012-12-21 NOTE — [facility] Group Notes (Signed)
[facility] LCSW Group Therapy  12/21/2012 12:02 PM   Type of Therapy:  Group Therapy  Participation Level:  Active  Participation Quality:  Attentive  Affect:  Appropriate  Cognitive:  Appropriate  Insight:  Improving  Engagement in Therapy:  Engaged  Modes of Intervention:  Clarification, Education, Exploration and Socialization  Summary of Progress/Problems: Today's group focused on relapse prevention.  We defined the term, and then brainstormed on ways to prevent relapse.  Sarah Mclaughlin identified a need for recovery from both emotional issues as well as substance abuse.  She shared that her pattern in the past has put other's needs before herself, and when that did not have the desired result, she would turn to pills to lessen the pain.  She went on to share that her best help in recovery and relapse prevention is her higher power, through prayer.  Daryel Gerald B 12/21/2012 , 12:02 PM

## 2012-12-21 NOTE — Progress Notes (Addendum)
Patient in day room during this assessment. She appeared to be doing well. She reported that she had a pretty good day. Patient's mood and affects bright and appropriate. She endorsed difficulty falling asleep last  night and requested for a sleep medication at HS. She also complaint having stress incontinent.  Patient denied SI/HI and denied hallucinations. She seemed excited about possibility of long term treatment at discharge. Q 15 minute check continues as ordered to maintain safety.

## 2012-12-21 NOTE — Progress Notes (Signed)
Patient ID: Sarah Mclaughlin, female   DOB: 1956/05/08, 57 y.o.   MRN: [id number] A:Patient denies any withdrawal symptoms today; she remains anxious at times, but not overtly showing symptoms.  She denies any SI/HI/AVH.  Patient met with treatment team and expressed her desire for continuing treatment for her drug abuse.  Patient stated that she had a prior endoscopy done and was given propanol for sedation and had an adverse reaction to it.  She also admitted that she went home and took a klonopin after the procedure.  She stated that "I was talking to my daughter and she wasn't there."  Patient admitted that she has been on xanax over 10 years.  She has good insight regarding her prescription use and her symptoms prior to her hospitalization.  Patient is open to the idea of continuing treatment at [facility] or [facility].  Patient's librium and clonidine protocol has been discontinued.  A: Continue to monitor medication management and MD orders.  Safety checks completed every 15 minutes.  R: Patient is receptive to treatment.

## 2012-12-21 NOTE — ED Provider Notes (Signed)
Medical screening examination/treatment/procedure(s) were performed by non-physician practitioner and as supervising physician I was immediately available for consultation/collaboration.  Dhillon Comunale David Jordyne Poehlman, MD 12/21/12 1427 

## 2012-12-21 NOTE — Progress Notes (Addendum)
Patient in bed awake at the beginning of this shift. She reported that she got a message from her social worker earlier and she would be going to a long term treatment facility. She also said she had difficulty falling asleep because she had a lot on her mind. She appeared somewhat anxious and apprehensive. Writer offered Vistaril 25 mg to patient. She received this medication without difficulty. Q 15 minute check continues as ordered to maintain safety.

## 2012-12-22 ENCOUNTER — Telehealth: Payer: Self-pay | Admitting: Family Medicine

## 2012-12-22 DIAGNOSIS — K259 Gastric ulcer, unspecified as acute or chronic, without hemorrhage or perforation: Secondary | ICD-10-CM

## 2012-12-22 MED ORDER — TRAZODONE HCL 100 MG PO TABS
100.0000 mg | ORAL_TABLET | Freq: Every evening | ORAL | Status: DC | PRN
  Administered 2012-12-22: 100 mg via ORAL
  Filled 2012-12-22: qty 1
  Filled 2012-12-22: qty 14

## 2012-12-22 NOTE — Telephone Encounter (Signed)
Sue Lush, Will you please let Nakaya's daughter, Morrie Sheldon @ [phone], know that it looks like Annelisa is doing much better and is engaging with her treatment team based on notes I see in the system.  I spoke with Morrie Sheldon over the weekend and wanted to see if she had been able to talk to her mom and be involved in what was going on within the hospital.  Also, Kniyah's stomach biopsies did not show any concerning findings, the ulcer should slowly improve with the acid reducing medication she's receiving.

## 2012-12-22 NOTE — Telephone Encounter (Signed)
Spoke with Sarah Mclaughlin and she states she has been able to speak to her mother and they are moving forward with the rehab tx facility. She is not sure if she will be released today or tomorrow. Sarah Mclaughlin was notified about biopsies also

## 2012-12-22 NOTE — Progress Notes (Signed)
Patient ID: Sarah Mclaughlin, female   DOB: 17-May-1956, 57 y.o.   MRN: [id number]  [facility] MD Progress Note  12/22/2012 2:31 PM Shilpa Bushee  MRN:  [id number] Subjective:  Patient states "I do have an issues with abusing medications. I can have a headaches and take three motrin instead of just two. Overusing medications is an issue I"m now willing to admit." Patient reports feeling anxious today over needing to call different treatment centers stating "One rejected me because I am on antipsychotic medication. They really put me through the ringer with questions." Rates her anxiety at five.   Objective:  The patient is observed asking to use the phone to place a call. Her affect is very anxious and patient appears restless.    Diagnosis:   Axis I: Major depressive disorder with psychosis           Generalized anxiety disorder            Opioid abuse. Benzodiazepine abuse. Axis II: Deferred Axis III:  Past Medical History  Diagnosis Date  . Hypertension   . Anxiety and depression 08/31/2012  . Gastric ulcer    Axis IV: economic problems, occupational problems, other psychosocial or environmental problems and problems related to social environment Axis V: 41-50  ADL's:  Intact  Sleep: Poor  Appetite:  Fair  Suicidal Ideation:  Denies Homicidal Ideation:  Denies AEB (as evidenced by):  Psychiatric Specialty Exam: Review of Systems  Constitutional: Positive for malaise/fatigue.  HENT: Negative.   Eyes: Negative.   Respiratory: Negative.   Cardiovascular: Negative.   Gastrointestinal: Negative for abdominal pain (Patient reports must be careful with food selections due to current ulcer. ) and blood in stool.  Genitourinary: Positive for urgency.  Musculoskeletal: Negative.   Skin: Negative.   Neurological: Negative.   Endo/Heme/Allergies: Negative.   Psychiatric/Behavioral: Positive for depression and substance abuse. Negative for suicidal ideas, hallucinations and memory  loss. The patient is nervous/anxious and has insomnia.     Blood pressure 102/76, pulse 78, temperature 97.3 F (36.3 C), temperature source Oral, resp. rate 16, height 4' 11.45" (1.51 m), weight 57.607 kg (127 lb).Body mass index is 25.27 kg/(m^2).  General Appearance: Neat and Well Groomed  Patent attorney::  Good  Speech:  Clear and Coherent and Normal Rate  Volume:  Normal  Mood:  Anxious  Affect:  Congruent  Thought Process:  Goal Directed and Intact  Orientation:  Full (Time, Place, and Person)  Thought Content:  Rumination  Suicidal Thoughts:  No  Homicidal Thoughts:  No  Memory:  Immediate;   Good Recent;   Good Remote;   Good  Judgement:  Fair  Insight:  Shallow  Psychomotor Activity:  Restlessness  Concentration:  Fair  Recall:  Good  Akathisia:  No  Handed:  Right  AIMS (if indicated):     Assets:  Communication Skills Desire for Improvement Housing Intimacy Leisure Time Resilience Social Support  Sleep:  Number of Hours: 5   Current Medications: Current Facility-Administered Medications  Medication Dose Route Frequency Provider Last Rate Last Dose  . acetaminophen (TYLENOL) tablet 650 mg  650 mg Oral Q6H PRN Earney Navy, NP      . alum & mag hydroxide-simeth (MAALOX/MYLANTA) 200-200-20 MG/5ML suspension 30 mL  30 mL Oral Q4H PRN Earney Navy, NP      . cloNIDine (CATAPRES) tablet 0.1 mg  0.1 mg Oral BID PRN [name]      . dicyclomine (BENTYL) tablet 20 mg  20 mg Oral Q6H PRN Verne Spurr, PA-C      . haloperidol (HALDOL) tablet 1 mg  1 mg Oral QHS [name]   1 mg at 12/21/12 2135  . hydrOXYzine (ATARAX/VISTARIL) tablet 25 mg  25 mg Oral Q6H PRN Verne Spurr, PA-C   25 mg at 12/22/12 1429  . loperamide (IMODIUM) capsule 2-4 mg  2-4 mg Oral PRN Verne Spurr, PA-C      . magnesium hydroxide (MILK OF MAGNESIA) suspension 30 mL  30 mL Oral Daily PRN Earney Navy, NP      . miconazole (MICOTIN) 2 % cream   Topical BID Mojeed  Akintayo      . multivitamin with minerals tablet 1 tablet  1 tablet Oral Daily Verne Spurr, PA-C   1 tablet at 12/22/12 0750  . nicotine (NICODERM CQ - dosed in mg/24 hours) patch 21 mg  21 mg Transdermal Daily PRN Earney Navy, NP      . ondansetron (ZOFRAN-ODT) disintegrating tablet 4 mg  4 mg Oral Q6H PRN Verne Spurr, PA-C      . pantoprazole (PROTONIX) EC tablet 40 mg  40 mg Oral BID Earney Navy, NP   40 mg at 12/22/12 0750  . propranolol (INDERAL) tablet 10 mg  10 mg Oral BID [name]   10 mg at 12/22/12 0750  . sertraline (ZOLOFT) tablet 50 mg  50 mg Oral Daily [name]   50 mg at 12/22/12 0750  . sucralfate (CARAFATE) 1 GM/10ML suspension 1 g  1 g Oral TID WC & HS Vassie Loll, MD   1 g at 12/22/12 1201  . thiamine (B-1) injection 100 mg  100 mg Intramuscular Once [employer], PA-C      . thiamine (VITAMIN B-1) tablet 100 mg  100 mg Oral Daily Verne Spurr, PA-C   100 mg at 12/22/12 0749  . traMADol (ULTRAM) tablet 50 mg  50 mg Oral Q6H PRN Vassie Loll, MD   50 mg at 12/22/12 1429  . traZODone (DESYREL) tablet 75 mg  75 mg Oral QHS PRN [name]   75 mg at 12/21/12 2134    Lab Results:  No results found for this or any previous visit (from the past 48 hour(s)).  Physical Findings: AIMS: Facial and Oral Movements Muscles of Facial Expression: None, normal Lips and Perioral Area: None, normal Jaw: None, normal Tongue: None, normal,Extremity Movements Upper (arms, wrists, hands, fingers): None, normal Lower (legs, knees, ankles, toes): None, normal, Trunk Movements Neck, shoulders, hips: None, normal, Overall Severity Severity of abnormal movements (highest score from questions above): None, normal Incapacitation due to abnormal movements: None, normal Patient's awareness of abnormal movements (rate only patient's report): No Awareness, Dental Status Current problems with teeth and/or dentures?: No Does patient usually wear dentures?: No   CIWA:  CIWA-Ar Total: 0 COWS:     Treatment Plan Summary: Daily contact with patient to assess and evaluate symptoms and progress in treatment Medication management  Plan: Continue crisis management and stabilization.  Medication management: Reviewed with patient who stated no untoward effects. Continue with current regimen.  Encouraged patient to attend groups and participate in group counseling sessions and activities.  Increase Trazodone 100mg  po Qhs PRN Insomnia. Discharge plan in progress. Patient is making calls to get into a rehab center. Anticipate d/c tomorrow.  Address health issues: Vitals reviewed and stable. Patient complains of increased symptoms of stress incontinence. She reports that she will follow with her PCP.  Medical Decision Making Problem Points:  Review of psycho-social stressors (1) Data Points:  Review of medication regiment & side effects (2)  I certify that inpatient services furnished can reasonably be expected to improve the patient's condition.   Candace Cruise 12/22/2012, 2:31 PM

## 2012-12-22 NOTE — Progress Notes (Signed)
D: Patient denies SI/HI and auditory and visual hallucinations. The patient has an anxious mood and affect. The patient rates her depression a 5 out of 10 and her hopelessness a 1 out of 10 (1 low/10 high). The patient reports sleeping fairly well and writes that her appetite is good and that her energy level is low. The patient states that her "anxiety and panic attacks" are "much better." Patient states that her "thoughts are getting more clear" as well.  A: Patient given emotional support from RN. Patient encouraged to come to staff with concerns and/or questions. Patient's medication routine continued. Patient's orders and plan of care reviewed.  R: Patient remains cooperative. Will continue to monitor patient q15 minutes for safety.

## 2012-12-22 NOTE — Progress Notes (Signed)
Recreation Therapy Notes   Date: 07.23.2014 Time: 9:30am Location: [address] Dayroom      Group Topic/Focus: Goal Setting  Participation Level: Minimal  Participation Quality: Appropriate  Affect: Euthymic  Cognitive: Oriented  Additional Comments: Activity: Goal Sheet ; Explanation: Patients were asked to identify two goals and the dates in which they would like to accomplish goals by.  Patient actively participated in group session. Patient shared that her goals are to find a job and to go to rehab. Patient shared that following discharge she is going to a 28-day program for substance abuse. Patient shared that she has talked this decision over with her daughter and she is comfortable and confident in this decision. Patient shared that in the past she has set goals, but she does not like to because if she does not reach them she feels a sense of disappointment. Patient additionally shared that she sets a lot of goals at once. LRT offered patient guidance for setting a small number of goals at a time so they are achievable. Patient verbalized understanding of setting smaller number of goals as well as goals that are reachable to prevent feelings of disappointment.   Marykay Lex Cherelle Midkiff, LRT/CTRS  Margurette Brener L 12/22/2012 5:30 PM

## 2012-12-22 NOTE — Progress Notes (Signed)
The focus of this group is to help patients review their daily goal of treatment and discuss progress on daily workbooks. Pt attended the evening group session and responded to all discussion prompts from the Writer. Pt reported having a good day and having attended all of her groups. Pt shared that she found today's groups especially interesting and that she was grateful for the compassion staff were showing towards her. Pt also said that she was very happy to have gone outside to play basketball. Pt's affect was appropriate.

## 2012-12-22 NOTE — [facility] Group Notes (Signed)
[facility] LCSW Aftercare Discharge Planning Group Note   12/22/2012 9:29 AM  Participation Quality:  Engaged  Mood/Affect:  Excited  Depression Rating:    Anxiety Rating:    Thoughts of Suicide:  No Will you contract for safety?   NA  Current AVH:  No  Plan for Discharge/Comments:  Sarah Mclaughlin is a bit excited today about her d/c plan today.  She wants to go directly into rehab from here, and is a bit frustrated with the process of interviewing with [facility].  We went over the plan of me calling [facility] today to find out about openings, and her following up with [facility].  She states that if she cannot get into a rehab directly from here, she will stay with her daughter until she can.  Transportation Means: family  Supports: family  [name], [name]

## 2012-12-22 NOTE — Progress Notes (Signed)
Adult Psychoeducational Group Note  Date:  12/22/2012 Time:  11:00am Group Topic/Focus:  Personal Choices and Values:   The focus of this group is to help patients assess and explore the importance of values in their lives, how their values affect their decisions, how they express their values and what opposes their expression.  Participation Level:  Active  Participation Quality:  Appropriate and Attentive  Affect:  Appropriate  Cognitive:  Appropriate  Insight: Appropriate  Engagement in Group:  Engaged  Modes of Intervention:  Discussion and Education  Additional Comments:  Pt. Attended and participated in group. Pt stated that her long term goal was learn her medication and go to rehab as well as find herself a job.  Shelly Bombard D 12/22/2012, 1:31 PM

## 2012-12-22 NOTE — [facility] Group Notes (Signed)
[facility] Group Notes:  (Counselor/Nursing/MHT/Case Management/Adjunct)  12/22/2012 1:15PM  Type of Therapy:  Group Therapy  Participation Level:  Active  Participation Quality:  Appropriate  Affect:  Prropriate  Cognitive:  Oriented  Insight:  Improving  Engagement in Group:  Engaged  Engagement in Therapy:  Engaged  Modes of Intervention:  Discussion, Exploration and Socialization  Summary of Progress/Problems: The topic for group was balance in life.  Pt participated in the discussion about when their life was in balance and out of balance and how this feels.  Pt discussed ways to get back in balance and short term goals they can work on to get where they want to be. Sarah Mclaughlin states she is unbalanced today due to medical issues.  Also, she realizes that this is not reality, so one can feel balanced here, but potentially become unbalanced when leaving here and facing challenges again.  She plans to stay balanced by learning to love herself again.  This includes becoming active, pampering herself, cooking and shopping.  She also emphasized not taking pills.   Daryel Gerald B 12/22/2012 2:11 PM

## 2012-12-22 NOTE — Progress Notes (Signed)
D: Patient in day room during this assessment. She appeared bright on approach; mood and affects appropriate. She reported a good day. Denied SI/HI and denied hallucinations. She excited about going to a long term substance abuse treatment. She said she'll be going to a treatment facility in [place]. Patient said she spoke with the facility and they wanted her to bring copy of her medications with her. A: Writer encouraged patient to discuss with her case manager and that at discharge she would have list of her medications on her discharge paperwork. R: patient receptive to encouragement and support. Q 15 minute check continues as ordered to maintain safety.

## 2012-12-23 DIAGNOSIS — F1320 Sedative, hypnotic or anxiolytic dependence, uncomplicated: Secondary | ICD-10-CM

## 2012-12-23 DIAGNOSIS — F1120 Opioid dependence, uncomplicated: Secondary | ICD-10-CM

## 2012-12-23 MED ORDER — HALOPERIDOL 1 MG PO TABS: 1.0000 mg | ORAL_TABLET | Freq: Every day | ORAL | Status: DC

## 2012-12-23 MED ORDER — SERTRALINE HCL 50 MG PO TABS: 50.0000 mg | ORAL_TABLET | Freq: Every day | ORAL | Status: DC

## 2012-12-23 MED ORDER — LISINOPRIL-HYDROCHLOROTHIAZIDE 20-25 MG PO TABS: 0.5000 | ORAL_TABLET | Freq: Every day | ORAL | Status: DC

## 2012-12-23 MED ORDER — PANTOPRAZOLE SODIUM 40 MG PO TBEC: 40.0000 mg | DELAYED_RELEASE_TABLET | Freq: Two times a day (BID) | ORAL | Status: DC

## 2012-12-23 MED ORDER — SUCRALFATE 1 GM/10ML PO SUSP: 1.0000 g | Freq: Three times a day (TID) | ORAL | Status: DC

## 2012-12-23 MED ORDER — PROPRANOLOL HCL 10 MG PO TABS: 10.0000 mg | ORAL_TABLET | Freq: Two times a day (BID) | ORAL | Status: DC

## 2012-12-23 MED ORDER — TRAZODONE HCL 100 MG PO TABS: 100.0000 mg | ORAL_TABLET | Freq: Every evening | ORAL | Status: DC | PRN

## 2012-12-23 NOTE — Tx Team (Signed)
  Interdisciplinary Treatment Plan Update   Date Reviewed:  12/23/2012  Time Reviewed:  9:57 AM  Progress in Treatment:   Attending groups: Yes Participating in groups: Yes Taking medication as prescribed: Yes  Tolerating medication: Yes Family/Significant other contact made: Yes  Patient understands diagnosis: Yes  Discussing patient identified problems/goals with staff: Yes Medical problems stabilized or resolved: Yes Denies suicidal/homicidal ideation: Yes Patient has not harmed self or others: Yes  For review of initial/current patient goals, please see plan of care.  Estimated Length of Stay:  D/C today  Reason for Continuation of Hospitalization:   New Problems/Goals identified:  N/A  Discharge Plan or Barriers:   got to [facility] from here for rehab  Additional Comments:  Attendees:  Signature: Thedore Mins, MD 12/23/2012 9:57 AM   Signature: Richelle Ito, LCSW 12/23/2012 9:57 AM  Signature: Fransisca Kaufmann, NP 12/23/2012 9:57 AM  Signature: Joslyn Devon, RN 12/23/2012 9:57 AM  Signature:  12/23/2012 9:57 AM  Signature:  12/23/2012 9:57 AM  Signature:   12/23/2012 9:57 AM  Signature:    Signature:    Signature:    Signature:    Signature:    Signature:      Scribe for Treatment Team:   Richelle Ito, LCSW  12/23/2012 9:57 AM

## 2012-12-23 NOTE — Progress Notes (Signed)
[facility] Adult Case Management Discharge Plan :  Will you be returning to the same living situation after discharge: No. At discharge, do you have transportation home?:Yes,  family Do you have the ability to pay for your medications:Yes,  mental health  Release of information consent forms completed and in the chart;  Patient's signature needed at discharge.  Patient to Follow up at:   Patient denies SI/HI:   Yes,  yes    Safety Planning and Suicide Prevention discussed:  Yes,  yes  Sarah Mclaughlin 12/23/2012, 10:31 AM

## 2012-12-23 NOTE — [facility] Suicide Risk Assessment (Signed)
Suicide Risk Assessment  Discharge Assessment     Demographic Factors:  Low socioeconomic status, Unemployed and female  Mental Status Per Nursing Assessment::   On Admission:  Suicidal ideation indicated by patient  Current Mental Status by Physician: patient denies suicidal ideation, intent or plan  Loss Factors: Financial problems/change in socioeconomic status  Historical Factors: Family history of mental illness or substance abuse and Impulsivity  Risk Reduction Factors:   Sense of responsibility to family, Living with another person, especially a relative and Positive social support  Continued Clinical Symptoms:  Resolving anxiety, mood and psychosis  Cognitive Features That Contribute To Risk:  Closed-mindedness Polarized thinking    Suicide Risk:  Minimal: No identifiable suicidal ideation.  Patients presenting with no risk factors but with morbid ruminations; may be classified as minimal risk based on the severity of the depressive symptoms  Discharge Diagnoses:   AXIS I:  Major depressive disorder with psychosis              Generalized anxiety disorder              Opioid dependence              Benzodiazepine dependence  AXIS II:  Deferred AXIS III:   Past Medical History  Diagnosis Date  . Hypertension   . Gastric ulcer    AXIS IV:  other psychosocial or environmental problems and problems related to social environment AXIS V:  61-70 mild symptoms  Plan Of Care/Follow-up recommendations:  Activity:  as tolerated Diet:  healthy Tests:  routine Other:  patient to keep her after care appointment  Is patient on multiple antipsychotic therapies at discharge:  No   Has Patient had three or more failed trials of antipsychotic monotherapy by history:  No  Recommended Plan for Multiple Antipsychotic Therapies: N/A  Brennden Masten,MD 12/23/2012, 9:20 AM

## 2012-12-23 NOTE — Discharge Summary (Signed)
Physician Discharge Summary Note  Patient:  Sarah Mclaughlin is an 57 y.o., female MRN:  [id number] DOB:  1955-06-16 Patient phone:  [phone] (home)  Patient address:   [address] [place] [address]   Date of Admission:  12/18/2012 Date of Discharge: 12/23/2012  Discharge Diagnoses: Principal Problem:   Psychosis Active Problems:   Anxiety and depression   Opiate abuse, episodic   Benzodiazepine dependence   Acute blood loss anemia  Axis Diagnosis:  AXIS I: Major depressive disorder with psychosis  Generalized anxiety disorder  Opioid dependence  Benzodiazepine dependence  AXIS II: Deferred  AXIS III:  Past Medical History   Diagnosis  Date   .  Hypertension    .  Gastric ulcer    AXIS IV: other psychosocial or environmental problems and problems related to social environment  AXIS V: 61-70 mild symptoms  Level of Care:  OP  Hospital Course:   This is a voluntary admission for Sarah Mclaughlin who is a 57 year old divorced white female. She presented to her primary care reporting auditory and visual hallucinations decreased sleep decreased appetite she rates her depression as an 8/10 she said suicidal thoughts but no plan and she can contract for safety denies homicidal ideation the patient reports she's had auditory and visual hallucinations for the past 7 months she reports her anxiety as a 7/10.  While a patient in this hospital, Sarah Mclaughlin was enrolled in group counseling and activities as well as received the following medication Current facility-administered medications:acetaminophen (TYLENOL) tablet 650 mg, 650 mg, Oral, Q6H PRN, Earney Navy, NP, 650 mg at 12/23/12 0631;  alum & mag hydroxide-simeth (MAALOX/MYLANTA) 200-200-20 MG/5ML suspension 30 mL, 30 mL, Oral, Q4H PRN, Earney Navy, NP;  cloNIDine (CATAPRES) tablet 0.1 mg, 0.1 mg, Oral, BID PRN, [name];  dicyclomine (BENTYL) tablet 20 mg, 20 mg, Oral, Q6H PRN, Verne Spurr,  PA-C haloperidol (HALDOL) tablet 1 mg, 1 mg, Oral, QHS, [name], 1 mg at 12/22/12 2115;  hydrOXYzine (ATARAX/VISTARIL) tablet 25 mg, 25 mg, Oral, Q6H PRN, Verne Spurr, PA-C, 25 mg at 12/22/12 1429;  loperamide (IMODIUM) capsule 2-4 mg, 2-4 mg, Oral, PRN, Verne Spurr, PA-C;  magnesium hydroxide (MILK OF MAGNESIA) suspension 30 mL, 30 mL, Oral, Daily PRN, Earney Navy, NP;  miconazole (MICOTIN) 2 % cream, , Topical, BID, [name] multivitamin with minerals tablet 1 tablet, 1 tablet, Oral, Daily, Verne Spurr, PA-C, 1 tablet at 12/23/12 0731;  nicotine (NICODERM CQ - dosed in mg/24 hours) patch 21 mg, 21 mg, Transdermal, Daily PRN, Earney Navy, NP;  ondansetron (ZOFRAN-ODT) disintegrating tablet 4 mg, 4 mg, Oral, Q6H PRN, Verne Spurr, PA-C;  pantoprazole (PROTONIX) EC tablet 40 mg, 40 mg, Oral, BID, Earney Navy, NP, 40 mg at 12/23/12 0731 propranolol (INDERAL) tablet 10 mg, 10 mg, Oral, BID, [name], 10 mg at 12/23/12 0731;  sertraline (ZOLOFT) tablet 50 mg, 50 mg, Oral, Daily, [name], 50 mg at 12/23/12 0731;  sucralfate (CARAFATE) 1 GM/10ML suspension 1 g, 1 g, Oral, TID WC & HS, Vassie Loll, MD, 1 g at 12/23/12 0800;  thiamine (B-1) injection 100 mg, 100 mg, Intramuscular, Once, Verne Spurr, PA-C thiamine (VITAMIN B-1) tablet 100 mg, 100 mg, Oral, Daily, Verne Spurr, PA-C, 100 mg at 12/23/12 0731;  traMADol (ULTRAM) tablet 50 mg, 50 mg, Oral, Q6H PRN, Vassie Loll, MD, 50 mg at 12/23/12 0945;  traZODone (DESYREL) tablet 100 mg, 100 mg, Oral, QHS PRN, Fransisca Kaufmann, NP, 100 mg at  12/22/12 2116 Current outpatient prescriptions:haloperidol (HALDOL) 1 MG tablet, Take 1 tablet (1 mg total) by mouth at bedtime., Disp: 30 tablet, Rfl: 0;  lisinopril-hydrochlorothiazide (PRINZIDE,ZESTORETIC) 20-25 MG per tablet, Take 0.5 tablets by mouth daily., Disp: 90 tablet, Rfl: 3;  pantoprazole (PROTONIX) 40 MG tablet, Take 1 tablet (40 mg total) by mouth 2 (two)  times daily. For treatment of gastric ulcer., Disp: 60 tablet, Rfl: 0 propranolol (INDERAL) 10 MG tablet, Take 1 tablet (10 mg total) by mouth 2 (two) times daily. For anxiety., Disp: 60 tablet, Rfl: 0;  sertraline (ZOLOFT) 50 MG tablet, Take 1 tablet (50 mg total) by mouth daily. For depression and anxiety., Disp: 30 tablet, Rfl: 0;  sucralfate (CARAFATE) 1 GM/10ML suspension, Take 10 mLs (1 g total) by mouth 4 (four) times daily -  with meals and at bedtime. To promote healing of gastric ulcer., Disp: 420 mL, Rfl: 0 traZODone (DESYREL) 100 MG tablet, Take 1 tablet (100 mg total) by mouth at bedtime as needed for sleep., Disp: 30 tablet, Rfl: 0 The patient became clearer after her first day of admission. She began to deny any hallucinations stating "I think the sedative given during the stomach biopsy and other medications I was taking interacted on me. I now can admit that I have a substance abuse problem. I will overtake anything that you give me, even motrin. I am willing to go to rehab for myself and my family. They are concerned about me." The patient's daughter reported to the case manager that her mother would hide medicines such as Klonopin all around the house and then appeared altered mentally. The patient reported her main problem was a chronically high anxiety level. The patient was started on several non-habit forming medications to target this problem such as Zoloft 50 mg daily, Inderal 10 mg BID, and Haldol 1 mg at hs. The patient functioned very well on the unit but continued to appear very anxious. Sarah Mclaughlin was accepted into a rehab center in [place] called [facility] and left from the hospital to begin her treatment there. The patient was very optimistic about her future. She received prescriptions for her new medications and a two week supply of medications. The patient showed no signs of psychosis on the day of her discharge.Patient attended treatment team meeting this am and met  with treatment team members. Pt symptoms, treatment plan and response to treatment discussed. Sarah Mclaughlin endorsed that their symptoms have improved. Pt also stated that they are stable for discharge.  In other to control Principal Problem:   Psychosis Active Problems:   Anxiety and depression   Opiate abuse, episodic   Benzodiazepine dependence   Acute blood loss anemia , they will continue psychiatric care on outpatient basis. They will follow-up at  Follow-up Information   Follow up with [facility]. (Report today by 4PM)    Contact information:   [address]  [address]  [place], [place]  [phone]  [phone]    .  In addition they were instructed to take all your medications as prescribed by your mental healthcare provider, to report any adverse effects and or reactions from your medicines to your outpatient provider promptly, patient is instructed and cautioned to not engage in alcohol and or illegal drug use while on prescription medicines, in the event of worsening symptoms, patient is instructed to call the crisis hotline, 911 and or go to the nearest ED for appropriate evaluation and treatment of symptoms.  Upon discharge, patient adamantly denies suicidal, homicidal ideations, auditory, visual hallucinations and or delusional thinking. They left [facility] with all personal belongings in no apparent distress.  Consults:  See electronic record for details  Significant Diagnostic Studies:  See electronic record for details  Discharge Vitals:   Blood pressure 100/73, pulse 79, temperature 97.3 F (36.3 C), temperature source Oral, resp. rate 21, height 4' 11.45" (1.51 m), weight 57.607 kg (127 lb)..  Mental Status Exam: See Mental Status Examination and Suicide Risk Assessment completed by Attending Physician prior to discharge.  Discharge destination:  Home  Is patient on multiple antipsychotic therapies at discharge:  No  Has Patient had three or more failed trials of  antipsychotic monotherapy by history: N/A Recommended Plan for Multiple Antipsychotic Therapies: N/A     Discharge Orders   Future Orders Complete By Expires     Activity as tolerated - No restrictions  As directed         Medication List    STOP taking these medications       dexlansoprazole 60 MG capsule  Commonly known as:  DEXILANT      TAKE these medications     Indication   haloperidol 1 MG tablet  Commonly known as:  HALDOL  Take 1 tablet (1 mg total) by mouth at bedtime.   Indication:  Psychosis     lisinopril-hydrochlorothiazide 20-25 MG per tablet  Commonly known as:  PRINZIDE,ZESTORETIC  Take 0.5 tablets by mouth daily.   Indication:  High Blood Pressure     pantoprazole 40 MG tablet  Commonly known as:  PROTONIX  Take 1 tablet (40 mg total) by mouth 2 (two) times daily. For treatment of gastric ulcer.   Indication:  Stomach Ulcer, Treatment to Prevent Stress Ulcers     propranolol 10 MG tablet  Commonly known as:  INDERAL  Take 1 tablet (10 mg total) by mouth 2 (two) times daily. For anxiety.   Indication:  High Blood Pressure, anxiety/panic attacks     sertraline 50 MG tablet  Commonly known as:  ZOLOFT  Take 1 tablet (50 mg total) by mouth daily. For depression and anxiety.   Indication:  Anxiety Disorder, Major Depressive Disorder, Panic Disorder     sucralfate 1 GM/10ML suspension  Commonly known as:  CARAFATE  Take 10 mLs (1 g total) by mouth 4 (four) times daily -  with meals and at bedtime. To promote healing of gastric ulcer.   Indication:  Stomach Ulcer     traZODone 100 MG tablet  Commonly known as:  DESYREL  Take 1 tablet (100 mg total) by mouth at bedtime as needed for sleep.   Indication:  Trouble Sleeping       Follow-up Information   Follow up with [facility]. (Report today by 4PM)    Contact information:   [address]  [address]  [place], [place]  [phone]     Follow-up recommendations:   Activities: Resume  typical activities Diet: Resume typical diet Tests: none Other: Follow up with outpatient provider and report any side effects to out patient prescriber.  Comments:  Take all your medications as prescribed by your mental healthcare provider. Report any adverse effects and or reactions from your medicines to your outpatient provider promptly. Patient is instructed and cautioned to not engage in alcohol and or illegal drug use while on prescription medicines. In the event of worsening symptoms, patient is instructed to call the crisis hotline,  911 and or go to the nearest ED for appropriate evaluation and treatment of symptoms. Follow-up with your primary care provider for your other medical issues, concerns and or health care needs.  SignedFransisca Kaufmann NP-C 12/23/2012 3:12 PM

## 2012-12-23 NOTE — Progress Notes (Signed)
Pt discharged per MD orders; pt currently denies SI/HI and auditory/visual hallucinations; pt was given education by RN regarding follow-up appointments and medications and pt denied any questions or concerns about these instructions; pt was then escorted to search room to retrieve her belongings by RN before being discharged to hospital lobby. 

## 2012-12-24 NOTE — Discharge Summary (Signed)
Seen and agreed. Selso Mannor, MD 

## 2012-12-28 NOTE — Progress Notes (Signed)
Patient Discharge Instructions:  After Visit Summary (AVS):   Faxed to:  12/28/12 Discharge Summary Note:   Faxed to:  12/28/12 Psychiatric Admission Assessment Note:   Faxed to:  12/28/12 Suicide Risk Assessment - Discharge Assessment:   Faxed to:  12/28/12 Other:  12/28/12 Records sent via mail to: [facility] [address] [place] [address]  Jerelene Redden, 12/28/2012, 11:51 AM

## 2012-12-29 ENCOUNTER — Encounter: Payer: Self-pay | Admitting: Family Medicine

## 2013-01-03 LAB — CBC AND DIFFERENTIAL
HCT: 33 % — AB (ref 36–46)
Hemoglobin: 11.4 g/dL — AB (ref 12.0–16.0)

## 2013-01-24 ENCOUNTER — Encounter: Payer: Self-pay | Admitting: Family Medicine

## 2013-01-24 ENCOUNTER — Telehealth: Payer: Self-pay | Admitting: Family Medicine

## 2013-01-24 ENCOUNTER — Ambulatory Visit (INDEPENDENT_AMBULATORY_CARE_PROVIDER_SITE_OTHER): Payer: Self-pay | Admitting: Family Medicine

## 2013-01-24 VITALS — BP 147/107 | HR 104 | Wt 134.0 lb

## 2013-01-24 DIAGNOSIS — F329 Major depressive disorder, single episode, unspecified: Secondary | ICD-10-CM

## 2013-01-24 DIAGNOSIS — K259 Gastric ulcer, unspecified as acute or chronic, without hemorrhage or perforation: Secondary | ICD-10-CM

## 2013-01-24 DIAGNOSIS — M62838 Other muscle spasm: Secondary | ICD-10-CM

## 2013-01-24 DIAGNOSIS — F341 Dysthymic disorder: Secondary | ICD-10-CM

## 2013-01-24 DIAGNOSIS — I10 Essential (primary) hypertension: Secondary | ICD-10-CM

## 2013-01-24 DIAGNOSIS — F419 Anxiety disorder, unspecified: Secondary | ICD-10-CM

## 2013-01-24 MED ORDER — PANTOPRAZOLE SODIUM 40 MG PO TBEC: 40.0000 mg | DELAYED_RELEASE_TABLET | Freq: Two times a day (BID) | ORAL | Status: DC

## 2013-01-24 MED ORDER — LISINOPRIL-HYDROCHLOROTHIAZIDE 20-25 MG PO TABS: 0.5000 | ORAL_TABLET | Freq: Every day | ORAL | Status: DC

## 2013-01-24 MED ORDER — PROPRANOLOL HCL 10 MG PO TABS: 10.0000 mg | ORAL_TABLET | Freq: Two times a day (BID) | ORAL | Status: DC

## 2013-01-24 MED ORDER — TRAZODONE HCL 100 MG PO TABS: 100.0000 mg | ORAL_TABLET | Freq: Every evening | ORAL | Status: DC | PRN

## 2013-01-24 MED ORDER — SERTRALINE HCL 50 MG PO TABS: 50.0000 mg | ORAL_TABLET | Freq: Every day | ORAL | Status: DC

## 2013-01-24 MED ORDER — CYCLOBENZAPRINE HCL 10 MG PO TABS: ORAL_TABLET | ORAL | Status: DC

## 2013-01-24 NOTE — Telephone Encounter (Signed)
Faxed request to [phone]

## 2013-01-24 NOTE — Telephone Encounter (Signed)
Sue Lush, Will you please request ED records from [facility], i think the number is [phone] The visit would have been in July or August of this year

## 2013-01-24 NOTE — Telephone Encounter (Signed)
Sarah Mclaughlin, Thank you for getting these, Will you please let Deziah know that the CT report mentioned the beginning of a small bowel obstruction however since she's back to eating normally without vomiting it took care of itself on its own.  She'll still want to get a repeat EGD with her gastrologist in the middle of September.

## 2013-01-24 NOTE — Progress Notes (Signed)
CC: Sarah Mclaughlin is a 57 y.o. female is here for Follow-up   Subjective: HPI:  Followup anxiety depression: She left her rehabilitation facility this Saturday she plans on calling [facility] today to establish care with psychiatric services and therapy services. She stopped taking Haldol a few weeks ago she ran out of Zoloft late last week. She feels that the Zoloft was helping control her anxiety, this has also been improved with structured activities keeping her busy throughout the day. Symptoms seem to be worse during periods of inactivity and boredom. She feels her symptoms are worsening since stopping Zoloft she denies intolerance to Zoloft.  She plans on continuing progress with Narcotics Anonymous by attending Narcotics Anonymous at a [facility].  She is no longer taking any benzodiazepines or opiates.  Followup essential hypertension: No outside blood pressures to report she did not take lisinopril hydrochlorothiazide this morning. Denies chest pain, shortness of breath, orthopnea, nor peripheral edema nor motor or sensory disturbances  Followup gastric ulcer: She is unable to afford sucralfate but continues on Protonix twice a day. She denies any recent or remote abdominal pain however she did have coffee ground emesis sometime in early August while at her rehabilitation center. She tells me she was evaluated at local ED and that a MRI revealed a mass in her abdomen, she is unsure about any more specifics.  Denies tar like or blood in stool. Denies rapid heartbeat, fatigue, nor shortness of breath  Patient complains of shoulder tightness and spasm that is bilateral mild to moderate in severity present most days of the week worse in times of anxiety. Improves with physical activity, she has joined the [employer].  Localized between the shoulders and the neck. Interventions have included Tylenol does not help much    Review Of Systems Outlined In HPI  Past Medical History   Diagnosis Date  . Hypertension   . Anxiety and depression 08/31/2012  . Gastric ulcer      Family History  Problem Relation Age of Onset  . Heart attack Mother     father  . Diabetes Mother   . Hypertension Mother   . Stroke       History  Substance Use Topics  . Smoking status: Never Smoker   . Smokeless tobacco: Not on file  . Alcohol Use: Yes     Comment: occasional     Objective: Filed Vitals:   01/24/13 0908  BP: 147/107  Pulse: 104    General: Alert and Oriented, No Acute Distress HEENT: Pupils equal, round, reactive to light. Conjunctivae clear.  Moist mucous membranes pharynx unremarkable Lungs: Clear to auscultation bilaterally, no wheezing/ronchi/rales.  Comfortable work of breathing. Good air movement. Cardiac: Regular rate and rhythm. Normal S1/S2.  No murmurs, rubs, nor gallops.   Abdomen: Soft nontender Extremities: No peripheral edema.  Strong peripheral pulses. Mild upper trapezius hypertonicity bilaterally Mental Status: No depression, anxiety, nor agitation. Skin: Warm and dry.  Assessment & Plan: Erleen was seen today for follow-up.  Diagnoses and associated orders for this visit:  Gastric ulcer - pantoprazole (PROTONIX) 40 MG tablet; Take 1 tablet (40 mg total) by mouth 2 (two) times daily. For treatment of gastric ulcer.  Anxiety and depression - propranolol (INDERAL) 10 MG tablet; Take 1 tablet (10 mg total) by mouth 2 (two) times daily. For anxiety. - sertraline (ZOLOFT) 50 MG tablet; Take 1 tablet (50 mg total) by mouth daily. For depression and anxiety. - traZODone (DESYREL) 100 MG tablet; Take 1  tablet (100 mg total) by mouth at bedtime as needed for sleep.  Essential hypertension, benign - lisinopril-hydrochlorothiazide (PRINZIDE,ZESTORETIC) 20-25 MG per tablet; Take 0.5 tablets by mouth daily.  Muscle spasm - cyclobenzaprine (FLEXERIL) 10 MG tablet; Take a half to a full tab every 8-12 hours only as needed for muscle spasm, may cause  sedation.    Gastric ulcer: She will continue on Protonix twice a day, have requested records from [facility] in [place] to get a better idea of what evaluation they did. I've encouraged her to return to her GI physician in approximately 4 weeks for consideration of repeat EGD, we may have to expedite this based on the above results Anxiety and depression: Controlled improved I encouraged her to restart Zoloft and continue to use trazodone as needed at night propranolol as needed for anxiety Essential hypertension: She was doing well on lisinopril hydrochlorothiazide as an inpatient we will continue this encouraged her to take this daily Muscle spasm: As needed Flexeril in the evenings, strongly encouraged to avoid all nonsteroidal anti-inflammatories  Return in about 4 weeks (around 02/21/2013).

## 2013-01-25 NOTE — Telephone Encounter (Signed)
Left complete message on pt vm

## 2013-01-27 ENCOUNTER — Encounter: Payer: Self-pay | Admitting: *Deleted

## 2013-02-04 ENCOUNTER — Encounter: Payer: Self-pay | Admitting: Family Medicine

## 2013-02-25 ENCOUNTER — Telehealth: Payer: Self-pay | Admitting: Family Medicine

## 2013-02-25 DIAGNOSIS — F329 Major depressive disorder, single episode, unspecified: Secondary | ICD-10-CM

## 2013-02-25 DIAGNOSIS — F419 Anxiety disorder, unspecified: Secondary | ICD-10-CM

## 2013-02-25 MED ORDER — SERTRALINE HCL 50 MG PO TABS: 50.0000 mg | ORAL_TABLET | Freq: Every day | ORAL | Status: DC

## 2013-02-25 NOTE — Telephone Encounter (Signed)
Pt req

## 2013-02-28 ENCOUNTER — Telehealth: Payer: Self-pay | Admitting: Family Medicine

## 2013-02-28 NOTE — Telephone Encounter (Signed)
Note from [facility] with patient requesting increase in trazodone and zoloft, urge f/u for consideration of dosage change.

## 2013-03-21 ENCOUNTER — Ambulatory Visit (INDEPENDENT_AMBULATORY_CARE_PROVIDER_SITE_OTHER): Payer: 59 | Admitting: Family Medicine

## 2013-03-21 ENCOUNTER — Encounter: Payer: Self-pay | Admitting: Family Medicine

## 2013-03-21 VITALS — BP 168/122 | HR 76 | Wt 141.0 lb

## 2013-03-21 DIAGNOSIS — I10 Essential (primary) hypertension: Secondary | ICD-10-CM

## 2013-03-21 DIAGNOSIS — M629 Disorder of muscle, unspecified: Secondary | ICD-10-CM

## 2013-03-21 DIAGNOSIS — F419 Anxiety disorder, unspecified: Secondary | ICD-10-CM

## 2013-03-21 DIAGNOSIS — M6289 Other specified disorders of muscle: Secondary | ICD-10-CM

## 2013-03-21 DIAGNOSIS — F341 Dysthymic disorder: Secondary | ICD-10-CM

## 2013-03-21 DIAGNOSIS — F329 Major depressive disorder, single episode, unspecified: Secondary | ICD-10-CM

## 2013-03-21 DIAGNOSIS — K259 Gastric ulcer, unspecified as acute or chronic, without hemorrhage or perforation: Secondary | ICD-10-CM

## 2013-03-21 MED ORDER — SERTRALINE HCL 100 MG PO TABS: 100.0000 mg | ORAL_TABLET | Freq: Every day | ORAL | Status: DC

## 2013-03-21 MED ORDER — LISINOPRIL-HYDROCHLOROTHIAZIDE 20-25 MG PO TABS: 0.5000 | ORAL_TABLET | Freq: Every day | ORAL | Status: DC

## 2013-03-21 MED ORDER — TRAZODONE HCL 150 MG PO TABS: 150.0000 mg | ORAL_TABLET | Freq: Every evening | ORAL | Status: DC | PRN

## 2013-03-21 MED ORDER — ORPHENADRINE CITRATE ER 100 MG PO TB12: 100.0000 mg | ORAL_TABLET | Freq: Two times a day (BID) | ORAL | Status: DC

## 2013-03-21 NOTE — Progress Notes (Signed)
CC: Sarah Mclaughlin is a 57 y.o. female is here for f/u anxiety and depression   Subjective: HPI:  Followup anxiety and depression: Patient started taking 100 mg of Zoloft 3 weeks ago and noticed that both depression and anxiety was mild at that time is now absent. She's also taking trazodone 200 mg most nights of the week only at bedtime and denies any difficulty standing or getting asleep. She denies mental disturbance she is quite happy with her current lack of anxiety and depression symptoms. 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 No outside blood pressures to report. Today's blood pressure reflects her taking only propranolol. She denies headaches, motor sensory disturbances, chest pain, shortness of breath, orthopnea nor peripheral edema  Followup gastric ulcer she is due for repeat EGD with digestive health specialist, she has been putting this off due to financial issues she continues on Protonix twice a day she denies dark stools or Donald pain  Patient complains of muscle stiffness primarily in the upper back that has been present on daily basis for the past month slightly improved with Tylenol. She is avoiding nonsteroidal anti-inflammatories. Slightly improved with Flexeril however this causes sedation. Pain is nonradiating improves with hot tub nothing else makes better or worse   Review Of Systems Outlined In HPI  Past Medical History  Diagnosis Date  . Hypertension   . Anxiety and depression 08/31/2012  . Gastric ulcer      Family History  Problem Relation Age of Onset  . Heart attack Mother     father  . Diabetes Mother   . Hypertension Mother   . Stroke       History  Substance Use Topics  . Smoking status: Never Smoker   . Smokeless tobacco: Not on file  . Alcohol Use: Yes     Comment: occasional      Objective: Filed Vitals:   03/21/13 0825  BP: 168/122  Pulse: 76    General: Alert and Oriented, No Acute Distress HEENT: Pupils equal, round, reactive to light. Conjunctivae clear.  Moist mucous membranes pharynx unremarkable Lungs: Clear to auscultation bilaterally, no wheezing/ronchi/rales.  Comfortable work of breathing. Good air movement. Cardiac: Regular rate and rhythm. Normal S1/S2.  No murmurs, rubs, nor gallops.   Abdomen: Soft nontender Extremities: No peripheral edema.  Strong peripheral pulses.  Mental Status: No depression, anxiety, nor agitation. Skin: Warm and dry.  Assessment & Plan: [name] was seen today for f/u anxiety and depression.  Diagnoses and associated orders for this visit:  Anxiety and depression - sertraline (ZOLOFT) 100 MG tablet; Take 1 tablet (100 mg total) by mouth daily. For depression and anxiety. - traZODone (DESYREL) 150 MG tablet; Take 1 tablet (150 mg total) by mouth at bedtime as needed for sleep.  Essential hypertension, benign - lisinopril-hydrochlorothiazide (PRINZIDE,ZESTORETIC) 20-25 MG per tablet; Take 0.5 tablets by mouth daily.  Gastric ulcer  Muscle stiffness - orphenadrine (NORFLEX) 100 MG tablet; Take 1 tablet (100 mg total) by mouth 2 (two) times daily.    Anxiety and depression: Controlled continue Zoloft 100 and trazodone now taking 150mg  Essential hypertension: Uncontrolled chronic condition strongly urged to restart hydrochlorothiazide repeat blood pressure 1-2 weeks Gastric ulcer: Strongly encouraged her to follow up with GI before the end of the month for followup EGD Muscle stiffness: Switch to  orphenadrine from cyclobenzaprine still avoid all nonsteroidal anti-inflammatories until after EGD results  Return in about 2 weeks (around 04/04/2013).

## 2013-06-03 ENCOUNTER — Other Ambulatory Visit: Payer: Self-pay | Admitting: Family Medicine

## 2013-06-20 ENCOUNTER — Encounter: Payer: Self-pay | Admitting: Family Medicine

## 2013-06-20 ENCOUNTER — Ambulatory Visit (INDEPENDENT_AMBULATORY_CARE_PROVIDER_SITE_OTHER): Payer: Self-pay | Admitting: Family Medicine

## 2013-06-20 VITALS — BP 177/118 | HR 85 | Wt 148.0 lb

## 2013-06-20 DIAGNOSIS — R059 Cough, unspecified: Secondary | ICD-10-CM

## 2013-06-20 DIAGNOSIS — F341 Dysthymic disorder: Secondary | ICD-10-CM

## 2013-06-20 DIAGNOSIS — F419 Anxiety disorder, unspecified: Secondary | ICD-10-CM

## 2013-06-20 DIAGNOSIS — K219 Gastro-esophageal reflux disease without esophagitis: Secondary | ICD-10-CM

## 2013-06-20 DIAGNOSIS — I10 Essential (primary) hypertension: Secondary | ICD-10-CM

## 2013-06-20 DIAGNOSIS — R05 Cough: Secondary | ICD-10-CM

## 2013-06-20 DIAGNOSIS — K259 Gastric ulcer, unspecified as acute or chronic, without hemorrhage or perforation: Secondary | ICD-10-CM

## 2013-06-20 DIAGNOSIS — F329 Major depressive disorder, single episode, unspecified: Secondary | ICD-10-CM

## 2013-06-20 MED ORDER — ESOMEPRAZOLE MAGNESIUM 40 MG PO CPDR: 40.0000 mg | DELAYED_RELEASE_CAPSULE | Freq: Every day | ORAL | Status: DC

## 2013-06-20 MED ORDER — SERTRALINE HCL 100 MG PO TABS: ORAL_TABLET | ORAL | Status: DC

## 2013-06-20 MED ORDER — TRAZODONE HCL 150 MG PO TABS: 150.0000 mg | ORAL_TABLET | Freq: Every evening | ORAL | Status: DC | PRN

## 2013-06-20 MED ORDER — LISINOPRIL-HYDROCHLOROTHIAZIDE 20-25 MG PO TABS: 0.5000 | ORAL_TABLET | Freq: Every day | ORAL | Status: DC

## 2013-06-20 MED ORDER — PROPRANOLOL HCL 10 MG PO TABS: 10.0000 mg | ORAL_TABLET | Freq: Two times a day (BID) | ORAL | Status: DC

## 2013-06-20 NOTE — Progress Notes (Signed)
CC: Sarah Mclaughlin is a 58 y.o. female is here for Anxiety   Subjective: HPI:  Followup anxiety and depression: States that anxiety is currently nonexistent however depression is mild to moderate severity on a daily basis described as "the blues" has been present for the past 2 months nothing particularly makes it better or worse. She continues on trazodone and Zoloft without missed doses. States she has no hobbies to look for 2 and usually goes to bed 15 minutes after coming home from work because she has not else to do. She still thoroughly enjoys her job along with visiting with her daughter once a week. Denies thoughts wanting to harm herself or others. No alcohol or recreational drug use.  Complains of a bubbling sensation in the top of her abdomen is described as an acid sensation that climbs up the back of her sternum. It has been of moderate to mild severity over the past month improves with occasional use of omeprazole. She has not taken his medication consecutive days for more than a week. Symptoms returned after stopping the medication. She also reports nonproductive cough is mild worse in the evenings after lying down. Both of the above symptoms seem to be worse after eating large meals or lying down soon after eating anything.  Denies unintentional weight loss. Reports she's having vomiting most days of the week that occurs after taking medications such as her lisinopril/hydrochlorothiazide. Denies any gross blood or coffee ground emesis. Denies any melena or blood in her stool. She denies nausea, vomiting comes without warning, denies loss of appetite  Essential hypertension: States she is taking lisinopril-hydrochlorothiazide on a daily basis. No outside blood pressures to report. Denies chest pain, shortness of breath, nor motor or sensory disturbances  Review Of Systems Outlined In HPI  Past Medical History  Diagnosis Date  . Hypertension   . Anxiety and depression 08/31/2012  .  Gastric ulcer      Family History  Problem Relation Age of Onset  . Heart attack Mother     father  . Diabetes Mother   . Hypertension Mother   . Stroke       History  Substance Use Topics  . Smoking status: Never Smoker   . Smokeless tobacco: Not on file  . Alcohol Use: Yes     Comment: occasional     Objective: Filed Vitals:   06/20/13 1412  BP: 177/118  Pulse: 85    General: Alert and Oriented, No Acute Distress HEENT: Pupils equal, round, reactive to light. Conjunctivae clear.  External ears unremarkable, canals clear with intact TMs with appropriate landmarks.  Middle ear appears open without effusion. Pink inferior turbinates.  Moist mucous membranes, pharynx without inflammation nor lesions.  Neck supple without palpable lymphadenopathy nor abnormal masses. Lungs: Clear to auscultation bilaterally, no wheezing/ronchi/rales.  Comfortable work of breathing. Good air movement. Cardiac: Regular rate and rhythm. Normal S1/S2.  No murmurs, rubs, nor gallops.   Abdomen: Soft nontender Extremities: No peripheral edema.  Strong peripheral pulses.  Mental Status: Mild depression however no anxiety, nor agitation. Skin: Warm and dry.  Assessment & Plan: [name] was seen today for anxiety.  Diagnoses and associated orders for this visit:  Anxiety and depression - sertraline (ZOLOFT) 100 MG tablet; TAKE 1.5 TABLET DAILY FOR DEPRESSION AND ANXIETY - traZODone (DESYREL) 150 MG tablet; Take 1 tablet (150 mg total) by mouth at bedtime as needed for sleep. - propranolol (INDERAL) 10 MG tablet; Take 1 tablet (10 mg total) by  mouth 2 (two) times daily. For anxiety.  GERD (gastroesophageal reflux disease)  Cough  Gastric ulcer  Essential hypertension, benign - lisinopril-hydrochlorothiazide (PRINZIDE,ZESTORETIC) 20-25 MG per tablet; Take 0.5 tablets by mouth daily.    Anxiety: Controlled, continue Zoloft Depression: Uncontrolled continue trazodone increasing Zoloft GERD  with a history of gastric ulcer: I've given her one month of samples for Nexium I've asked her to use this on a daily basis and stressed the importance of her having her EGD given history of gastric ulcer and recent symptoms including vomiting, she will have insurance on February 1 and I have strongly encouraged her to followup with her GI doctor ASAP Cough: Suspect this is due to uncontrolled GERD, and hopefully this will resolve itself soon after starting Nexium Essential hypertension: Controlled continue lisinopril and hydrochlorothiazide she was normotensive on this medication when she was an inpatient last year, suspicion that she may not be getting this into her system due to recent vomiting  Return in about 4 weeks (around 07/18/2013).

## 2013-12-28 ENCOUNTER — Other Ambulatory Visit: Payer: Self-pay | Admitting: Family Medicine

## 2014-03-31 DIAGNOSIS — F411 Generalized anxiety disorder: Secondary | ICD-10-CM | POA: Insufficient documentation

## 2014-03-31 DIAGNOSIS — S37009A Unspecified injury of unspecified kidney, initial encounter: Secondary | ICD-10-CM | POA: Insufficient documentation

## 2014-04-03 DIAGNOSIS — I10 Essential (primary) hypertension: Secondary | ICD-10-CM | POA: Insufficient documentation

## 2014-04-03 DIAGNOSIS — Z79899 Other long term (current) drug therapy: Secondary | ICD-10-CM | POA: Insufficient documentation

## 2014-04-05 ENCOUNTER — Encounter: Payer: Self-pay | Admitting: Psychiatry

## 2014-04-12 ENCOUNTER — Ambulatory Visit (HOSPITAL_COMMUNITY): Payer: Self-pay | Admitting: Licensed Clinical Social Worker

## 2014-04-14 DIAGNOSIS — Z8669 Personal history of other diseases of the nervous system and sense organs: Secondary | ICD-10-CM | POA: Insufficient documentation

## 2014-04-14 DIAGNOSIS — F339 Major depressive disorder, recurrent, unspecified: Secondary | ICD-10-CM | POA: Insufficient documentation

## 2014-04-17 ENCOUNTER — Ambulatory Visit (INDEPENDENT_AMBULATORY_CARE_PROVIDER_SITE_OTHER): Payer: BC Managed Care – PPO | Admitting: Licensed Clinical Social Worker

## 2014-04-17 ENCOUNTER — Ambulatory Visit (HOSPITAL_COMMUNITY): Payer: Self-pay | Admitting: Licensed Clinical Social Worker

## 2014-04-17 DIAGNOSIS — F4310 Post-traumatic stress disorder, unspecified: Secondary | ICD-10-CM

## 2014-04-17 DIAGNOSIS — F3181 Bipolar II disorder: Secondary | ICD-10-CM
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
## 2014-04-18 ENCOUNTER — Encounter (INDEPENDENT_AMBULATORY_CARE_PROVIDER_SITE_OTHER): Payer: Self-pay

## 2014-04-19 ENCOUNTER — Inpatient Hospital Stay (HOSPITAL_COMMUNITY)
Admission: EM | Admit: 2014-04-19 | Discharge: 2014-04-24 | DRG: 885 | Disposition: A | Discharge status: Home / Self Care | Payer: BC Managed Care – PPO | Source: Intra-hospital | Attending: Psychiatry | Admitting: Psychiatry

## 2014-04-19 ENCOUNTER — Encounter (HOSPITAL_COMMUNITY): Payer: Self-pay | Admitting: Emergency Medicine

## 2014-04-19 ENCOUNTER — Emergency Department (HOSPITAL_COMMUNITY)
Admission: EM | Admit: 2014-04-19 | Discharge: 2014-04-19 | Disposition: A | Discharge status: Other Acute Inpatient Hospital | Payer: BC Managed Care – PPO | Attending: Emergency Medicine | Admitting: Emergency Medicine

## 2014-04-19 ENCOUNTER — Ambulatory Visit (INDEPENDENT_AMBULATORY_CARE_PROVIDER_SITE_OTHER): Payer: BC Managed Care – PPO | Admitting: Physician Assistant

## 2014-04-19 VITALS — BP 127/82 | HR 83 | Ht 61.0 in | Wt 149.0 lb

## 2014-04-19 DIAGNOSIS — Z79899 Other long term (current) drug therapy: Secondary | ICD-10-CM

## 2014-04-19 DIAGNOSIS — F1124 Opioid dependence with opioid-induced mood disorder: Secondary | ICD-10-CM | POA: Diagnosis present

## 2014-04-19 DIAGNOSIS — F1320 Sedative, hypnotic or anxiolytic dependence, uncomplicated: Secondary | ICD-10-CM

## 2014-04-19 DIAGNOSIS — F419 Anxiety disorder, unspecified: Secondary | ICD-10-CM | POA: Diagnosis present

## 2014-04-19 DIAGNOSIS — F332 Major depressive disorder, recurrent severe without psychotic features: Principal | ICD-10-CM | POA: Diagnosis present

## 2014-04-19 DIAGNOSIS — K259 Gastric ulcer, unspecified as acute or chronic, without hemorrhage or perforation: Secondary | ICD-10-CM | POA: Diagnosis not present

## 2014-04-19 DIAGNOSIS — F1110 Opioid abuse, uncomplicated: Secondary | ICD-10-CM

## 2014-04-19 DIAGNOSIS — Z8711 Personal history of peptic ulcer disease: Secondary | ICD-10-CM

## 2014-04-19 DIAGNOSIS — F339 Major depressive disorder, recurrent, unspecified: Secondary | ICD-10-CM

## 2014-04-19 DIAGNOSIS — I10 Essential (primary) hypertension: Secondary | ICD-10-CM | POA: Diagnosis present

## 2014-04-19 DIAGNOSIS — Z880 Allergy status to penicillin: Secondary | ICD-10-CM | POA: Insufficient documentation

## 2014-04-19 DIAGNOSIS — F329 Major depressive disorder, single episode, unspecified: Secondary | ICD-10-CM | POA: Diagnosis present

## 2014-04-19 DIAGNOSIS — R45851 Suicidal ideations: Secondary | ICD-10-CM

## 2014-04-19 DIAGNOSIS — K219 Gastro-esophageal reflux disease without esophagitis: Secondary | ICD-10-CM

## 2014-04-19 DIAGNOSIS — F1910 Other psychoactive substance abuse, uncomplicated: Secondary | ICD-10-CM

## 2014-04-19 DIAGNOSIS — F331 Major depressive disorder, recurrent, moderate: Secondary | ICD-10-CM

## 2014-04-19 DIAGNOSIS — F338 Other recurrent depressive disorders: Secondary | ICD-10-CM | POA: Diagnosis not present

## 2014-04-19 LAB — COMPREHENSIVE METABOLIC PANEL
ALT: 19 U/L (ref 0–35)
ANION GAP: 13 (ref 5–15)
AST: 19 U/L (ref 0–37)
Albumin: 3.9 g/dL (ref 3.5–5.2)
Alkaline Phosphatase: 127 U/L — ABNORMAL HIGH (ref 39–117)
BILIRUBIN TOTAL: 0.3 mg/dL (ref 0.3–1.2)
BUN: 13 mg/dL (ref 6–23)
CHLORIDE: 99 meq/L (ref 96–112)
CO2: 28 mEq/L (ref 19–32)
Calcium: 9.5 mg/dL (ref 8.4–10.5)
Creatinine, Ser: 0.83 mg/dL (ref 0.50–1.10)
GFR, EST AFRICAN AMERICAN: 88 mL/min — AB (ref >90)
GFR, EST NON AFRICAN AMERICAN: 76 mL/min — AB (ref >90)
Glucose, Bld: 113 mg/dL — ABNORMAL HIGH (ref 70–99)
Potassium: 4 mEq/L (ref 3.7–5.3)
Sodium: 140 mEq/L (ref 137–147)
Total Protein: 8.4 g/dL — ABNORMAL HIGH (ref 6.0–8.3)

## 2014-04-19 LAB — ACETAMINOPHEN LEVEL: Acetaminophen (Tylenol), Serum: <15 ug/mL (ref 10–30)

## 2014-04-19 LAB — RAPID URINE DRUG SCREEN, HOSP PERFORMED
Amphetamines: NOT DETECTED
BARBITURATES: NOT DETECTED
Benzodiazepines: POSITIVE — AB
Cocaine: NOT DETECTED
Opiates: POSITIVE — AB
Tetrahydrocannabinol: NOT DETECTED

## 2014-04-19 LAB — CBC
HEMATOCRIT: 39 % (ref 36.0–46.0)
Hemoglobin: 13.1 g/dL (ref 12.0–15.0)
MCH: 31.8 pg (ref 26.0–34.0)
MCHC: 33.6 g/dL (ref 30.0–36.0)
MCV: 94.7 fL (ref 78.0–100.0)
Platelets: 359 10*3/uL (ref 150–400)
RBC: 4.12 MIL/uL (ref 3.87–5.11)
RDW: 14.1 % (ref 11.5–15.5)
WBC: 8.6 10*3/uL (ref 4.0–10.5)

## 2014-04-19 LAB — SALICYLATE LEVEL: Salicylate Lvl: <2 mg/dL — ABNORMAL LOW (ref 2.8–20.0)

## 2014-04-19 LAB — ETHANOL: Alcohol, Ethyl (B): <11 mg/dL (ref 0–11)

## 2014-04-19 MED ORDER — ALUM & MAG HYDROXIDE-SIMETH 200-200-20 MG/5ML PO SUSP: 30.0000 mL | ORAL | Status: DC | PRN

## 2014-04-19 MED ORDER — LORAZEPAM 1 MG PO TABS
1.0000 mg | ORAL_TABLET | Freq: Three times a day (TID) | ORAL | Status: DC | PRN
  Filled 2014-04-19: qty 1

## 2014-04-19 MED ORDER — IRBESARTAN 150 MG PO TABS: 150.0000 mg | ORAL_TABLET | Freq: Every day | ORAL | Status: DC

## 2014-04-19 MED ORDER — PANTOPRAZOLE SODIUM 40 MG PO TBEC: 40.0000 mg | DELAYED_RELEASE_TABLET | Freq: Every day | ORAL | Status: DC

## 2014-04-19 MED ORDER — PROPRANOLOL HCL 10 MG PO TABS
10.0000 mg | ORAL_TABLET | Freq: Two times a day (BID) | ORAL | Status: DC
  Administered 2014-04-20 – 2014-04-22 (×3): 10 mg via ORAL
  Filled 2014-04-19 (×11): qty 1

## 2014-04-19 MED ORDER — LORAZEPAM 1 MG PO TABS
2.0000 mg | ORAL_TABLET | Freq: Once | ORAL | Status: AC
  Administered 2014-04-19: 2 mg via ORAL
  Filled 2014-04-19: qty 2

## 2014-04-19 MED ORDER — LISINOPRIL-HYDROCHLOROTHIAZIDE 20-25 MG PO TABS: 0.5000 | ORAL_TABLET | Freq: Every day | ORAL | Status: DC

## 2014-04-19 MED ORDER — AMITRIPTYLINE HCL 25 MG PO TABS
25.0000 mg | ORAL_TABLET | Freq: Every day | ORAL | Status: DC
  Filled 2014-04-19 (×4): qty 1

## 2014-04-19 MED ORDER — NICOTINE 21 MG/24HR TD PT24: 21.0000 mg | MEDICATED_PATCH | Freq: Every day | TRANSDERMAL | Status: DC

## 2014-04-19 MED ORDER — HYDROCHLOROTHIAZIDE 12.5 MG PO CAPS: 12.5000 mg | ORAL_CAPSULE | Freq: Every day | ORAL | Status: DC

## 2014-04-19 MED ORDER — SERTRALINE HCL 50 MG PO TABS: 100.0000 mg | ORAL_TABLET | Freq: Every day | ORAL | Status: DC

## 2014-04-19 MED ORDER — PROPRANOLOL HCL 10 MG PO TABS
10.0000 mg | ORAL_TABLET | Freq: Two times a day (BID) | ORAL | Status: DC
  Filled 2014-04-19: qty 1

## 2014-04-19 MED ORDER — PANTOPRAZOLE SODIUM 40 MG PO TBEC
40.0000 mg | DELAYED_RELEASE_TABLET | Freq: Every day | ORAL | Status: DC
  Administered 2014-04-20 – 2014-04-24 (×4): 40 mg via ORAL
  Filled 2014-04-19 (×10): qty 1

## 2014-04-19 MED ORDER — HYDROCHLOROTHIAZIDE 12.5 MG PO CAPS
12.5000 mg | ORAL_CAPSULE | Freq: Every day | ORAL | Status: DC
  Administered 2014-04-20 – 2014-04-21 (×2): 12.5 mg via ORAL
  Filled 2014-04-19 (×4): qty 1

## 2014-04-19 MED ORDER — IBUPROFEN 200 MG PO TABS: 600.0000 mg | ORAL_TABLET | Freq: Three times a day (TID) | ORAL | Status: DC | PRN

## 2014-04-19 MED ORDER — ACETAMINOPHEN 325 MG PO TABS: 650.0000 mg | ORAL_TABLET | ORAL | Status: DC | PRN

## 2014-04-19 MED ORDER — ZOLPIDEM TARTRATE 10 MG PO TABS
10.0000 mg | ORAL_TABLET | Freq: Every evening | ORAL | Status: DC | PRN
  Filled 2014-04-19: qty 1

## 2014-04-19 MED ORDER — MAGNESIUM HYDROXIDE 400 MG/5ML PO SUSP: 30.0000 mL | Freq: Every day | ORAL | Status: DC | PRN

## 2014-04-19 MED ORDER — AMLODIPINE BESYLATE 10 MG PO TABS: 10.0000 mg | ORAL_TABLET | Freq: Every day | ORAL | Status: DC

## 2014-04-19 MED ORDER — ACETAMINOPHEN 325 MG PO TABS
650.0000 mg | ORAL_TABLET | ORAL | Status: DC | PRN
  Administered 2014-04-19: 650 mg via ORAL
  Filled 2014-04-19: qty 2

## 2014-04-19 MED ORDER — ONDANSETRON HCL 4 MG PO TABS: 4.0000 mg | ORAL_TABLET | Freq: Three times a day (TID) | ORAL | Status: DC | PRN

## 2014-04-19 MED ORDER — ACETAMINOPHEN 500 MG PO TABS: 500.0000 mg | ORAL_TABLET | Freq: Four times a day (QID) | ORAL | Status: DC | PRN

## 2014-04-19 MED ORDER — AMLODIPINE BESYLATE 10 MG PO TABS
10.0000 mg | ORAL_TABLET | Freq: Every day | ORAL | Status: DC
  Administered 2014-04-20 – 2014-04-24 (×3): 10 mg via ORAL
  Filled 2014-04-19 (×8): qty 1

## 2014-04-19 MED ORDER — ONDANSETRON HCL 4 MG PO TABS
4.0000 mg | ORAL_TABLET | Freq: Three times a day (TID) | ORAL | Status: DC | PRN
Start: 2014-04-19 — End: 2014-04-19

## 2014-04-19 MED ORDER — IRBESARTAN 150 MG PO TABS
150.0000 mg | ORAL_TABLET | Freq: Every day | ORAL | Status: DC
  Administered 2014-04-20 – 2014-04-24 (×4): 150 mg via ORAL
  Filled 2014-04-19 (×4): qty 1
  Filled 2014-04-19: qty 2
  Filled 2014-04-19 (×3): qty 1

## 2014-04-19 MED ORDER — HYDROXYZINE HCL 25 MG PO TABS
25.0000 mg | ORAL_TABLET | Freq: Four times a day (QID) | ORAL | Status: DC | PRN
  Administered 2014-04-19 – 2014-04-24 (×5): 25 mg via ORAL
  Filled 2014-04-19 (×6): qty 1

## 2014-04-19 NOTE — Progress Notes (Signed)
   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 She has a history of depression with psychosis as well as a history of substance abuse. She was evaluated earlier by LCSW for her symptoms of depression which have been worsening for several months, and was worked in today on my schedule due to the daughter's reports of increasing confusion and erratic behaviors.  Subjective: I can't wait until Monday to see the Doctor, I'm coming out of my skin! Sarah Mclaughlin states that she is depressed and doesn't know how she is going to pay the bills now that she is unemployed. She notes that she has had worsening symptoms of depression but can not say for how long. She reports that she can't remember anything at all and her memory is a problem.     She now states she has had thoughts of suicide and has now contemplated how to do this, including using razor blades, over dosing, and drinking alcohol.  As she lives alone, she has access to all of these items. She rates her depression as a 9/10 and states this is the worst it's ever been in the past. She is not currently psychotic.      Sarah Mclaughlin admits openly, "I'm an addict!" and reports that she has been going to several different doctors for medications, she reports sometimes the complaints are real and other times the complaints are made up to get the medication. She also reports that she has several friends who she can get medication from.  Objective:  The patient is alert and oriented x 3, her memory is appropriate at 1 and 5 minutes. She is tearful and anxious. Her speech is circumstantial, but normal volume, coherent and relevant. She can be redirected.  She is restless and shifts position several times per minute and appears to be in early withdrawal. She states the last time she had any  narcotics was last night. She states she is ready to get help and wants treatment.  Filed Vitals:   04/19/14 1356  BP: 127/82  Pulse: 83    Mental Status Examination  Appearance: casual Alert: Yes Attention: fair  Cooperative: Yes Eye Contact: Fair Speech: circumstantial, but redirectable, coherent, relevant Psychomotor Activity: Increased and Shuffling Gait Memory/Concentration: 3/3 at 1 minute and 5 minutes, concentration is poor Oriented: person, place, time/date, day of week and month of year Mood: Anxious and Depressed Affect: Labile and Tearful Thought Processes and Associations: Circumstantial Fund of Knowledge: Good Thought Content: Suicidal ideation Insight: Fair Judgement: Fair  Diagnosis: 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  Treatment Plan:  [facility]. [facility]. 2. 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 3. AC notified, ED NP is also notified.  Roderica Cathell, PA-C

## 2014-04-19 NOTE — BH Assessment (Signed)
Pt signed voluntary consent for treatment and release of information documents [facility] and sharing of information with daughter, Franki Cabotshley Wenger, and son-in-law, Brain.     Called and spoke with son-in-law. Advised that pt had signed voluntary consent for treatment and would be [facility].

## 2014-04-19 NOTE — ED Provider Notes (Signed)
CSN: [id number]     Arrival date & time 04/19/14  1605 History   First MD Initiated Contact with Patient 04/19/14 1644     Chief Complaint  Patient presents with  . Suicidal   Level V caveat for psychiatric illness.  (Consider location/radiation/quality/duration/timing/severity/associated sxs/prior Treatment) HPI  Patient has a history of depression. She reports she started seeing a new psychiatrist 2 days ago and spent 4 hours talking to the psychiatrist. She went to behavioral health today for having suicidal ideation. She states she's having "bad days". She initially told me she took her usual morning pills at 9 AM. When questioned further about her possible overdose she then admits she took for Valium 2 mg tablets and 5 hydrocodone at 11 AM. She states she "wanted to get his with sleep". However I pointed out to her this was not consistent with her feeling suicidal. She states she's been suicidal for a few weeks. She recently had a birthday however she lost her job yesterday. She states she had a suicide attempt 2 years ago when she tried to take an overdose of pills. Her son-in-law who is in the room states she has been severely depressed for several weeks and expressing suicidal thoughts. He states he is greatly concerned that she will harm herself.patient gets progressively more agitated during her interview and states she is tired of answering questions.  PCP Dr Dorothe Pea  Past Medical History  Diagnosis Date  . Hypertension   . Anxiety and depression 08/31/2012  . Gastric ulcer    Past Surgical History  Procedure Laterality Date  . Uterus ruptured  1998   Family History  Problem Relation Age of Onset  . Heart attack Mother     father  . Diabetes Mother   . Hypertension Mother   . Stroke     History  Substance Use Topics  . Smoking status: Never Smoker   . Smokeless tobacco: Not on file  . Alcohol Use: Yes     Comment: occasional   Lives alone Fired from her job  yesterday  OB History    No data available     Review of Systems  All other systems reviewed and are negative.     Allergies  Lisinopril; Penicillins; Trazodone and nefazodone; and Cyclobenzaprine  Home Medications   Prior to Admission medications   Medication Sig Start Date End Date Taking? Authorizing Provider  acetaminophen (TYLENOL) 500 MG tablet Take 500 mg by mouth every 6 (six) hours as needed for mild pain or headache.   Yes Historical Provider, MD  Amlodipine-Valsartan-HCTZ 10-160-12.5 MG TABS Take 1 tablet by mouth daily.    Yes Historical Provider, MD  esomeprazole (NEXIUM) 40 MG capsule Take 1 capsule (40 mg total) by mouth daily. 06/20/13 06/20/14 Yes Sean Hommel, DO  propranolol (INDERAL) 10 MG tablet Take 1 tablet (10 mg total) by mouth 2 (two) times daily. For anxiety. 06/20/13  Yes Sean Hommel, DO  traZODone (DESYREL) 150 MG tablet Take 1 tablet (150 mg total) by mouth at bedtime as needed for sleep. Patient not taking: Reported on 04/19/2014 06/20/13   Sean Hommel, DO   BP 127/93 mmHg  Pulse 88  Temp(Src) 97.5 F (36.4 C) (Oral)  Resp 16  SpO2 100%  Vital signs normal    Physical Exam  Constitutional: She is oriented to person, place, and time. She appears well-developed and well-nourished.  Non-toxic appearance. She does not appear ill. No distress.  HENT:  Head: Normocephalic and atraumatic.  Right Ear: External ear normal.  Left Ear: External ear normal.  Nose: Nose normal. No mucosal edema or rhinorrhea.  Mouth/Throat: Oropharynx is clear and moist and mucous membranes are normal. No dental abscesses or uvula swelling.  Eyes: Conjunctivae and EOM are normal. Pupils are equal, round, and reactive to light.  Neck: Normal range of motion and full passive range of motion without pain. Neck supple.  Cardiovascular: Normal rate, regular rhythm and normal heart sounds.  Exam reveals no gallop and no friction rub.   No murmur heard. Pulmonary/Chest: Effort  normal and breath sounds normal. No respiratory distress. She has no wheezes. She has no rhonchi. She has no rales. She exhibits no tenderness and no crepitus.  Abdominal: Soft. Normal appearance and bowel sounds are normal. She exhibits no distension. There is no tenderness. There is no rebound and no guarding.  Musculoskeletal: Normal range of motion. She exhibits no edema or tenderness.  Moves all extremities well.   Neurological: She is alert and oriented to person, place, and time. She has normal strength. No cranial nerve deficit.  Skin: Skin is warm, dry and intact. No rash noted. No erythema. No pallor.  Psychiatric: Her speech is normal. Her mood appears anxious. Her affect is labile.  Patient gets very easily agitated and aggressive. She starts speaking very loudly. She states she is tired of answering questions. She also is very angry at her son-in-law.  Nursing note and vitals reviewed.   ED Course  Procedures (including critical care time)  [facility] Dr Dub MikesLugo  ,Labs Review Results for orders placed or performed during the hospital encounter of 04/19/14  Acetaminophen level  Result Value Ref Range   Acetaminophen (Tylenol), Serum <15.0 10 - 30 ug/mL  CBC  Result Value Ref Range   WBC 8.6 4.0 - 10.5 K/uL   RBC 4.12 3.87 - 5.11 MIL/uL   Hemoglobin 13.1 12.0 - 15.0 g/dL   HCT 40.939.0 81.136.0 - 91.446.0 %   MCV 94.7 78.0 - 100.0 fL   MCH 31.8 26.0 - 34.0 pg   MCHC 33.6 30.0 - 36.0 g/dL   RDW 78.214.1 95.611.5 - 21.315.5 %   Platelets 359 150 - 400 K/uL  Comprehensive metabolic panel  Result Value Ref Range   Sodium 140 137 - 147 mEq/L   Potassium 4.0 3.7 - 5.3 mEq/L   Chloride 99 96 - 112 mEq/L   CO2 28 19 - 32 mEq/L   Glucose, Bld 113 (H) 70 - 99 mg/dL   BUN 13 6 - 23 mg/dL   Creatinine, Ser 0.860.83 0.50 - 1.10 mg/dL   Calcium 9.5 8.4 - 57.810.5 mg/dL   Total Protein 8.4 (H) 6.0 - 8.3 g/dL   Albumin 3.9 3.5 - 5.2 g/dL   AST 19 0 - 37 U/L   ALT 19 0 - 35 U/L   Alkaline  Phosphatase 127 (H) 39 - 117 U/L   Total Bilirubin 0.3 0.3 - 1.2 mg/dL   GFR calc non Af Amer 76 (L) >90 mL/min   GFR calc Af Amer 88 (L) >90 mL/min   Anion gap 13 5 - 15  Ethanol (ETOH)  Result Value Ref Range   Alcohol, Ethyl (B) <11 0 - 11 mg/dL  Salicylate level  Result Value Ref Range   Salicylate Lvl <2.0 (L) 2.8 - 20.0 mg/dL  Urine Drug Screen  Result Value Ref Range   Opiates POSITIVE (A) NONE DETECTED   Cocaine NONE DETECTED NONE DETECTED  Benzodiazepines POSITIVE (A) NONE DETECTED   Amphetamines NONE DETECTED NONE DETECTED   Tetrahydrocannabinol NONE DETECTED NONE DETECTED   Barbiturates NONE DETECTED NONE DETECTED   Laboratory interpretation all normal except positive UDS for opiates and benzos     Imaging Review No results found.   EKG Interpretation None      MDM   Final diagnoses:  Major depressive disorder, recurrent episode with atypical features    [facility]  Devoria AlbeIva Jannessa Ogden, MD, Franz DellFACEP     Celena Lanius L Madina Galati, MD 04/19/14 2123

## 2014-04-19 NOTE — Patient Instructions (Signed)
[facility]. [facility]. 2. Patient understands and agrees to go.

## 2014-04-19 NOTE — ED Notes (Signed)
Pt's daughter, Franki Cabotshley Wenger, best contact numbers [phone], and son in law Arlys JohnBrian [phone] They would like to be notified with plan of care after TTS evaluates patient.

## 2014-04-19 NOTE — ED Notes (Signed)
TTS in with patient.  

## 2014-04-19 NOTE — ED Notes (Signed)
[facility] Pt [facility] Jennette [facility]Neal [facility] and is currently taking IVC papers out on pt.  Pt states that she has been having SI thoughts of overdosing on pills while drinking alcohol.  Pt states that doctor told her that she is having benzo withdrawls.  Pt not wanting to be here and wanting to go home. Pt states that [facility] bc she has been there before and the people they are worse off than her.

## 2014-04-19 NOTE — ED Notes (Signed)
Ham sandwich, saline crackers, and ice water given to pt per request.

## 2014-04-19 NOTE — BH Assessment (Signed)
Assessment completed.  [facility], to advise that TTS assessment is complete and confirm that [facility].  Confirmed bed being held.  Tori asked that I confirm what time pt could come over through charge nurse.  Charge Nurse, Erskine SquibbJane, advised that pt could come over anytime in the next 20-30 mins.    Discussed TTS assessment with Donell SievertSpencer Simon, PA.  Lilia Arguedvised Spencer that pt still has SI with a plan,  Intent and means if discharged.  Advised Karleen HampshireSpencer that Erskine SquibbJane, Press photographerCharge Nurse, said pt [facility] mins.  Spoke with ED Nurse, Meghan, and advised [facility].  Spoke with Dr. Nilda CalamityMeghan Dockerty to advise of need for transfer.

## 2014-04-20 ENCOUNTER — Ambulatory Visit (HOSPITAL_COMMUNITY): Payer: Self-pay | Admitting: Psychiatry

## 2014-04-20 ENCOUNTER — Encounter (HOSPITAL_COMMUNITY): Payer: Self-pay | Admitting: Behavioral Health

## 2014-04-20 DIAGNOSIS — F1910 Other psychoactive substance abuse, uncomplicated: Secondary | ICD-10-CM

## 2014-04-20 DIAGNOSIS — F1124 Opioid dependence with opioid-induced mood disorder: Secondary | ICD-10-CM | POA: Insufficient documentation

## 2014-04-20 MED ORDER — LOPERAMIDE HCL 2 MG PO CAPS: 2.0000 mg | ORAL_CAPSULE | ORAL | Status: DC | PRN

## 2014-04-20 MED ORDER — CLONIDINE HCL 0.1 MG PO TABS: 0.1000 mg | ORAL_TABLET | Freq: Every day | ORAL | Status: DC

## 2014-04-20 MED ORDER — ONDANSETRON 4 MG PO TBDP
4.0000 mg | ORAL_TABLET | Freq: Four times a day (QID) | ORAL | Status: DC | PRN
  Administered 2014-04-23: 4 mg via ORAL
  Filled 2014-04-20: qty 1

## 2014-04-20 MED ORDER — NAPROXEN 500 MG PO TABS
500.0000 mg | ORAL_TABLET | Freq: Two times a day (BID) | ORAL | Status: DC | PRN
  Administered 2014-04-23 – 2014-04-24 (×2): 500 mg via ORAL
  Filled 2014-04-20 (×2): qty 1

## 2014-04-20 MED ORDER — CHLORDIAZEPOXIDE HCL 25 MG PO CAPS
25.0000 mg | ORAL_CAPSULE | Freq: Every day | ORAL | Status: AC
  Filled 2014-04-20: qty 1

## 2014-04-20 MED ORDER — ADULT MULTIVITAMIN W/MINERALS CH
1.0000 | ORAL_TABLET | Freq: Every day | ORAL | Status: DC
  Administered 2014-04-20 – 2014-04-24 (×3): 1 via ORAL
  Filled 2014-04-20 (×8): qty 1

## 2014-04-20 MED ORDER — CLONIDINE HCL 0.1 MG PO TABS
0.1000 mg | ORAL_TABLET | Freq: Four times a day (QID) | ORAL | Status: DC
  Administered 2014-04-20 – 2014-04-21 (×3): 0.1 mg via ORAL
  Filled 2014-04-20 (×10): qty 1

## 2014-04-20 MED ORDER — CLONIDINE HCL 0.1 MG PO TABS: 0.1000 mg | ORAL_TABLET | ORAL | Status: DC

## 2014-04-20 MED ORDER — VITAMIN B-1 100 MG PO TABS
100.0000 mg | ORAL_TABLET | Freq: Every day | ORAL | Status: DC
  Administered 2014-04-21 – 2014-04-24 (×2): 100 mg via ORAL
  Filled 2014-04-20 (×7): qty 1

## 2014-04-20 MED ORDER — CLONIDINE HCL 0.1 MG PO TABS: 0.1000 mg | ORAL_TABLET | Freq: Four times a day (QID) | ORAL | Status: DC

## 2014-04-20 MED ORDER — DICYCLOMINE HCL 20 MG PO TABS: 20.0000 mg | ORAL_TABLET | Freq: Four times a day (QID) | ORAL | Status: DC | PRN

## 2014-04-20 MED ORDER — METHOCARBAMOL 500 MG PO TABS
500.0000 mg | ORAL_TABLET | Freq: Three times a day (TID) | ORAL | Status: DC | PRN
  Administered 2014-04-22 – 2014-04-23 (×2): 500 mg via ORAL
  Filled 2014-04-20: qty 1
  Filled 2014-04-20: qty 20
  Filled 2014-04-20 (×2): qty 1

## 2014-04-20 MED ORDER — CHLORDIAZEPOXIDE HCL 25 MG PO CAPS
25.0000 mg | ORAL_CAPSULE | ORAL | Status: AC
  Administered 2014-04-22: 25 mg via ORAL
  Filled 2014-04-20 (×2): qty 1

## 2014-04-20 MED ORDER — CHLORDIAZEPOXIDE HCL 25 MG PO CAPS
25.0000 mg | ORAL_CAPSULE | Freq: Three times a day (TID) | ORAL | Status: AC
  Administered 2014-04-21 (×3): 25 mg via ORAL
  Filled 2014-04-20 (×3): qty 1

## 2014-04-20 MED ORDER — CLONIDINE HCL 0.1 MG PO TABS
0.1000 mg | ORAL_TABLET | ORAL | Status: DC
Start: 2014-04-22 — End: 2014-04-21
  Filled 2014-04-20: qty 1

## 2014-04-20 MED ORDER — CHLORDIAZEPOXIDE HCL 25 MG PO CAPS
25.0000 mg | ORAL_CAPSULE | Freq: Four times a day (QID) | ORAL | Status: AC | PRN
  Administered 2014-04-21: 25 mg via ORAL
  Filled 2014-04-20: qty 1

## 2014-04-20 MED ORDER — THIAMINE HCL 100 MG/ML IJ SOLN
100.0000 mg | Freq: Once | INTRAMUSCULAR | Status: AC
  Administered 2014-04-20: 100 mg via INTRAMUSCULAR
  Filled 2014-04-20: qty 2

## 2014-04-20 MED ORDER — CHLORDIAZEPOXIDE HCL 25 MG PO CAPS
25.0000 mg | ORAL_CAPSULE | Freq: Four times a day (QID) | ORAL | Status: AC
  Administered 2014-04-20 (×2): 25 mg via ORAL
  Filled 2014-04-20 (×3): qty 1

## 2014-04-20 NOTE — [facility] Suicide Risk Assessment (Signed)
   Nursing information obtained from:  Patient Demographic factors:  Female, Divorced or widowed, Caucasian, Living alone, Unemployed Current Mental Status:  Suicidal ideation indicated by patient, Self-harm thoughts, Self-harm behaviors Loss Factors:  Loss of significant relationship, Financial problems / change in socioeconomic status Historical Factors:  Prior suicide attempts, Impulsivity Risk Reduction Factors:  Religious beliefs about death, Positive social support, Positive therapeutic relationship Total Time spent with patient: 45 minutes  CLINICAL FACTORS:  Depression, suicidal ideations in the context of significant stressors   Psychiatric Specialty Exam: Physical Exam  ROS  Blood pressure 106/85, pulse 79, temperature 97.9 F (36.6 C), temperature source Oral, resp. rate 16, height 4\' 11"  (1.499 m), weight 65.318 kg (144 lb), SpO2 100 %.Body mass index is 29.07 kg/(m^2).  General Appearance: Fairly Groomed  Patent attorneyye Contact::  Good  Speech:  Normal Rate  Volume:  Normal  Mood:  Depressed  Affect:  Constricted  Thought Process:  Goal Directed and Linear  Orientation:  Other:  fully alert and attentive  Thought Content:  denies hallucinations, no delusions  Suicidal Thoughts:  No At this time denies any suicidal plan or intent and contracts for safety on the unit  Homicidal Thoughts:  No  Memory: Recent and Remote grossly intact  Judgement:  Fair  Insight:  Fair  Psychomotor Activity:  Decreased  Concentration:  Good  Recall:  Good  Fund of Knowledge:Good  Language: Good  Akathisia:  Negative  Handed:  Right  AIMS (if indicated):     Assets:  Desire for Improvement Physical Health Resilience  Sleep:      Musculoskeletal: Strength & Muscle Tone: within normal limits Gait & Station: normal Patient leans: N/A  COGNITIVE FEATURES THAT CONTRIBUTE TO RISK:  Closed-mindedness    SUICIDE RISK:   Moderate:  Frequent suicidal ideation with limited intensity, and duration,  some specificity in terms of plans, no associated intent, good self-control, limited dysphoria/symptomatology, some risk factors present, and identifiable protective factors, including available and accessible social support.  PLAN OF CARE: Patient will be admitted to inpatient psychiatric unit for stabilization and safety. Will provide and encourage milieu participation. Provide medication management and maked adjustments as needed. She is also on medication management to minimize risk of WDL symptoms. Will follow daily.    I certify that inpatient services furnished can reasonably be expected to improve the patient's condition.  Sarah Mclaughlin 04/20/2014, 4:14 PM

## 2014-04-20 NOTE — Progress Notes (Signed)
Recreation Therapy Notes  Animal-Assisted Activity/Therapy (AAA/T) Program Checklist/Progress Notes Patient Eligibility Criteria Checklist & Daily Group note for Rec Tx Intervention  Date: 11.19.2015 Time: 2:45pm Location: [address] Dayroom    AAA/T Program Assumption of Risk Form signed by Patient/ or Parent Legal Guardian yes  Patient is free of allergies or sever asthma yes  Patient reports no fear of animals yes  Patient reports no history of cruelty to animals yes   Patient understands his/her participation is voluntary yes  Patient washes hands before animal contact yes  Patient washes hands after animal contact yes  Behavioral Response: Observation   Education: Hand Washing, Appropriate Animal Interaction   Education Outcome: Acknowledges education.   Clinical Observations/Feedback: Patient arrived to session at approximately 3:05pm, observed peer interaction with therapy dog and left at 3:10pm.    Jearl Klinefelterenise L Laketta Soderberg, LRT/CTRS  Jearl KlinefelterBlanchfield, Jamirra Curnow L 04/20/2014 4:14 PM

## 2014-04-20 NOTE — [facility] Group Notes (Signed)
[facility] LCSW Group Therapy 04/20/2014  1:15 PM   Type of Therapy: Group Therapy  Participation Level: Did Not Attend- patient in bed.   Abrial Arrighi, MSW, LCSWA Clinical Social Worker [facility] [phone]    

## 2014-04-20 NOTE — Progress Notes (Signed)
NSG shift assessment. 7a-7p.   D: Pt was extremely anxious and upset this morning because there was no detox benzodiazepine ordered for her addiction. She was almost in a state of panic and said that she would not be able to contract for safety if we could not help her. She was seen by a physician and appropriate orders were written. After receiving medication she became apologetic. She complains of stress incontinence, but says that she is "soaking the pads" and frequently asked for more pads. She thinks some of the medication might be causing incontinence.  5:54 PM BP was 80/60 obtained manually. Medications held. Pt given Gatorade. This was reported to nurse practitioner. She was not dizzy or symptomatic. Her anxiety has been improved.   A: Reassurance that pt will be kept comfortable given. Observed pt interacting in group and in the milieu: Support and encouragement offered. Safety maintained with observations every 15 minutes.   R: Contracts for safety and continues to follow the treatment plan, working on learning new coping skills.

## 2014-04-20 NOTE — Tx Team (Signed)
Initial Interdisciplinary Treatment Plan   PATIENT STRESSORS: Financial difficulties Occupational concerns   PATIENT STRENGTHS: Ability for insight Wellsite geologistCommunication skills General fund of knowledge Physical Health Supportive family/friends   PROBLEM LIST: Problem List/Patient Goals Date to be addressed Date deferred Reason deferred Estimated date of resolution  "I really dont kno I jsu t dont ever want to come back here"      Suicidal ideations s/p od on medications      depression      anxiety      Auditory hallucinations                               DISCHARGE CRITERIA:  Ability to meet basic life and health needs Adequate post-discharge living arrangements Improved stabilization in mood, thinking, and/or behavior Motivation to continue treatment in a less acute level of care Need for constant or close observation no longer present Reduction of life-threatening or endangering symptoms to within safe limits Safe-care adequate arrangements made Verbal commitment to aftercare and medication compliance  PRELIMINARY DISCHARGE PLAN: Attend aftercare/continuing care group Attend PHP/IOP Outpatient therapy Participate in family therapy Return to previous living arrangement  PATIENT/FAMIILY INVOLVEMENT: This treatment plan has been presented to and reviewed with the patient, Sarah Mclaughlin.  The patient and family have been given the opportunity to ask questions and make suggestions.  Angeline SlimHill, Ashley M
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
## 2014-04-20 NOTE — Progress Notes (Signed)
Patient ID: Sarah Mclaughlin, female   DOB: 1956-04-22, 58 y.o.   MRN: [id number] Nursing Admission Note: 58 y/o female who presents with suicidal ideations and per patient she overdosed on Klonopin and Valium.  Per previous notes there were other medications.  Patient states she has been increasingly depressed.  Patient states "Life" is her stressor.  Patient states she lives alone and states she quit her job 04/18/2014.  Patient states she has been seeing a psychiatrist twice a week and her job would not work with her so she could go to her appointments.  Patient states she has attempted suicide in the past and states this is not [facility].  Patient states her depression gets bad this time of year and she does not know why.  Patient states she has a daughter and son in law who are supportive.  Patient currently denies HI but states she is passive SI.  Patient also states when she gets in this state of depression she sees spirits and hears voices.  Patient verbally contracts for safety.  Patient skin assessed and patient has old surgical healed scar to abdomen.  Consents obtained, fall safety plan explained and patient verbalized understanding.   Patient had no belongings to secure in a locker.  Food and fluids offered and patient refused both. Patient escorted and oriented to the unit.  Patient offered no additional questions or concerns.

## 2014-04-21 DIAGNOSIS — R45851 Suicidal ideations: Secondary | ICD-10-CM

## 2014-04-21 DIAGNOSIS — F1124 Opioid dependence with opioid-induced mood disorder: Secondary | ICD-10-CM

## 2014-04-21 DIAGNOSIS — F3130 Bipolar disorder, current episode depressed, mild or moderate severity, unspecified: Secondary | ICD-10-CM

## 2014-04-21 DIAGNOSIS — I10 Essential (primary) hypertension: Secondary | ICD-10-CM

## 2014-04-21 MED ORDER — TEMAZEPAM 7.5 MG PO CAPS
7.5000 mg | ORAL_CAPSULE | Freq: Every evening | ORAL | Status: DC | PRN
  Administered 2014-04-21 – 2014-04-23 (×3): 7.5 mg via ORAL
  Filled 2014-04-21 (×3): qty 1

## 2014-04-21 MED ORDER — GUAIFENESIN ER 600 MG PO TB12
600.0000 mg | ORAL_TABLET | Freq: Two times a day (BID) | ORAL | Status: DC
  Administered 2014-04-21 – 2014-04-24 (×4): 600 mg via ORAL
  Filled 2014-04-21 (×11): qty 1

## 2014-04-21 MED ORDER — LAMOTRIGINE 25 MG PO TABS
25.0000 mg | ORAL_TABLET | Freq: Every day | ORAL | Status: DC
  Administered 2014-04-21 – 2014-04-24 (×3): 25 mg via ORAL
  Filled 2014-04-21 (×2): qty 1
  Filled 2014-04-21: qty 14
  Filled 2014-04-21 (×3): qty 1
  Filled 2014-04-21: qty 14
  Filled 2014-04-21 (×2): qty 1

## 2014-04-21 NOTE — Tx Team (Signed)
Interdisciplinary Treatment Plan Update (Adult) Date: 04/21/2014   Time Reviewed: 9:30 AM  Progress in Treatment: Attending groups: Minimally Participating in groups: Minimally Taking medication as prescribed: Yes Tolerating medication: Yes Family/Significant other contact made: No, CSW continuing to assess for appropriate collateral contact Patient understands diagnosis: Yes Discussing patient identified problems/goals with staff: Yes Medical problems stabilized or resolved: Yes Denies suicidal/homicidal ideation: Patient reports that she can contract for safety while in the hospital but not if she returns home at this time. Issues/concerns per patient self-inventory: Yes Other:  New problem(s) identified: N/A  Discharge Plan or Barriers: Patient agreeable to return home to [facility] up [facility] [place] at discharge.   Reason for Continuation of Hospitalization:  Depression Anxiety Medication Stabilization   Comments: N/A  Estimated length of stay: 3-5 days  For review of initial/current patient goals, please see plan of care. Patient is a 58 year old Caucasian female with a diagnosis of Major Depressive Disorder, moderate and Sedative, Hypnotic, Anxiolytic Use Disorder. Patient lives in [place] alone. Patient reports experiencing worsening depression over the past 1.5 months and attempted to overdose on pills and ETOH. Patient identifies her support system as her daughter and son-in-law. [facility] [place]. Patient identifies her goal as "to feel better" and declines referrals to residential treatment at this time. Patient will benefit from crisis stabilization, medication evaluation, group therapy, and psycho education in addition to case management for discharge planning. Patient and CSW reviewed pt's identified goals and treatment plan. Pt verbalized understanding and agreed to treatment  plan.   Attendees: Patient:    Family:    Physician: Dr. Jama Flavorsobos 04/21/2014 9:30 AM  Nursing: Harold Barbanonecia Byrd, Lendell CapriceBrittany Guthrie RN 04/21/2014 9:30 AM  Clinical Social Worker: Samuella BruinKristin Lorn Butcher,  LCSWA 04/21/2014 9:30 AM  Other: Juline PatchQuylle Hodnett, LCSW 04/21/2014 9:30 AM  Other: Leisa LenzValerie Enoch, Vesta MixerMonarch Liaison 04/21/2014 9:30 AM  Other:  Sydell AxonElaina, Pharmacist 04/21/2014 9:30 AM  Other:  04/21/2014 9:30 AM  Other:    Other:    Other:    Other:    Other:     Other:        Scribe for Treatment Team:  Samuella BruinKristin Harman Ferrin, MSW, [employer]CSWA [phone]

## 2014-04-21 NOTE — [facility] Group Notes (Signed)
[facility] Group Notes:  activities  Date:  04/21/2014  Time:  10:56 AM  Type of Therapy:  Psychoeducational Skills  Participation Level:  Active  Participation Quality:  Appropriate  Affect:  Appropriate  Cognitive:  Appropriate  Insight:  Appropriate  Engagement in Group:  Engaged  Modes of Intervention:  Discussion  Summary of Progress/Problems:  Nicole CellaWebb, Velvet Moomaw Guyes 04/21/2014, 10:56 AM

## 2014-04-21 NOTE — Progress Notes (Signed)
D: Patient is alert and oriented. Pt's mood and affect is depressed, anxious, and sad. Pt reports her depression 9/10, hopelessness 8/10, and anxiety 10/10. Pt reports "I am really down today." Pt reports passive "SI" and states "I know when I leave here I will try to kill myself." Pt is concerned that her medications are not working. Pt states her goal for the day is to "start feeling happy get rid of this depression." Pt denies HI and AVH at this time. Pt's BP 78/45, pulse 65 at 1308. BP 82/60 at 1340. BP 88/60 at 1507. A: NP, Nicole Kindredgnes made aware of pts BP, Fluids given to pt, legs elevated. Pt encouraged to speak with provider about her medications. Verbal medication education reviewed with pt. Will provide pt with medication education hand outs. Support/Encouragement given to pt. Will monitor BP throughout the day. Scheduled medications administered per providers orders (See MAR). 15 minute checks completed per protocol for pt safety. R: Pt verbally contracts for safety. Pt is receptive and cooperative to nursing interventions.

## 2014-04-21 NOTE — Progress Notes (Signed)
D   Pt has been in her room this evening asleep  Does arouse but goes right back to sleep   Pt blood pressure is low and she has been encouraged to drink fluids and her medications have been held A   Verbal support given  Continue to evaluate effectiveness of medications   Q 15 min checks R   Pt safe at present

## 2014-04-21 NOTE — [facility] Group Notes (Signed)
   [facility] LCS[facility] Aftercare Discharge Planning Group Note  04/21/2014  8:45 AM   Participation Quality: Alert, Appropriate and Oriented  Mood/Affect: Depressed and Flat  Depression Rating: 10  Anxiety Rating: 10  Thoughts of Suicide: Pt states that she can contract for safety while at the hospital   Will you contract for safety? Yes  Current AVH: Pt denies  Plan for Discharge/Comments: Pt attended discharge planning group and actively participated in group. CSW provided pt with today's workbook. Patient reports feeling "terrible, worse" today. Patient reported feeling frustrated that her depression is not improving and frustrated about her medications. Patient reports that she wants to leave.  Transportation Means: Pt reports access to transportation  Supports: Patient identified her daughter and son-in-law as supports.  Samuella BruinKristin Stan Cantave, MSW, [employer]CSWA Clinical Social Worker [facility] [phone]

## 2014-04-21 NOTE — H&P (Signed)
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 She has a history of depression with psychosis as well as a history of substance abuse. She was evaluated earlier by LCSW for her symptoms of depression which have been worsening for several months, and was worked in today on my schedule due to the daughter's reports of increasing confusion and erratic behaviors.  Patient stated that she felt like coming out of her skin, she was depressed and worried about job loss and inability to pay her bills.  Furthermore, she verbalized that she is does not want to be a burden to her children during this time.  She has had thoughts of suicide and has now contemplated how to do this, including using razor blades, over dosing, and drinking alcohol.  She lives alone and states this is the worst her depression has ever been.  Sarah Mclaughlin admits openly, "I'm an addict!" and reports that she has been going to several different doctors for medications, she reports sometimes the complaints are real and other times the complaints are made up to get the medication. She also reports that she has several friends who she can get medication from.  Elements:  Location:  Substance abuse and major depression with SI. Quality:  Feelings of hopelessness, worthlessness. Severity:  Severe to develop into SI. Timing:  Could not recall when symptoms worsened. Duration:  Chronic. Context:  She lost her job, did not want to be a burden to family. Associated Signs/Synptoms: Depression Symptoms:  depressed mood, anhedonia, fatigue, hopelessness, anxiety, (Hypo) Manic Symptoms:  NA Anxiety Symptoms:  NA Psychotic Symptoms:  NA PTSD Symptoms: Negative Total Time spent with patient: 30  minutes  Psychiatric Specialty Exam: Physical Exam  Vitals reviewed. Cardiovascular: Intact distal pulses.   Psychiatric: Her speech is normal and behavior is normal. Judgment and thought content normal. Her mood appears anxious. Cognition and memory are normal. She exhibits a depressed mood.    Review of Systems  HENT: Negative.   Eyes: Negative.   Respiratory: Negative.   Cardiovascular: Negative.   Gastrointestinal: Negative.   Genitourinary: Positive for urgency.  Musculoskeletal: Negative.   Skin: Negative.   Neurological: Negative.   Endo/Heme/Allergies: Negative.   Psychiatric/Behavioral: Positive for depression and substance abuse. Negative for suicidal ideas, hallucinations and memory loss. The patient is nervous/anxious. The patient does not have insomnia.     Blood pressure 82/60, pulse 65, temperature 98.2 F (36.8 C), temperature source Oral, resp. rate 16, height 4' 11"  (1.499 m), weight 65.318 kg (144 lb), SpO2 100 %.Body mass index is 29.07 kg/(m^2).   General Appearance: Fairly Groomed  Engineer, water:: Good  Speech: Normal Rate  Volume: Normal  Mood: Depressed  Affect: Constricted  Thought Process: Goal Directed and Linear  Orientation: Other: fully alert and attentive  Thought Content: denies hallucinations, no delusions  Suicidal Thoughts: No At this time denies any suicidal plan or intent and contracts for safety on the unit  Homicidal Thoughts: No  Memory: Recent and Remote grossly intact  Judgement: Fair  Insight: Fair  Psychomotor Activity: Decreased  Concentration: Good  Recall: Good  Fund of Knowledge:Good  Language: Good  Akathisia: Negative  Handed: Right  AIMS (if indicated):    Assets: Desire for Improvement Physical Health Resilience  Sleep:  5-6 hrs   Musculoskeletal: Strength & Muscle Tone: within normal limits Gait & Station: normal Patient leans: N/A  Past Psychiatric  History: Diagnosis: Patient notes that she is to see a psychologist Dr. Billey Mclaughlin in [facility]   Hospitalizations: None   Outpatient Care: None   Substance Abuse Care: Alcohol abuse classes through the [facility]   Self-Mutilation: None   Suicidal Attempts: Suicide attempt in July 2013   Violent Behaviors: None    Past Medical History:   Past Medical History  Diagnosis Date  . Hypertension   . Anxiety and depression 08/31/2012  . Gastric ulcer    None. Allergies:   Allergies  Allergen Reactions  . Lisinopril Cough  . Penicillins Itching  . Trazodone And Nefazodone Itching    Frequent urination  . Cyclobenzaprine Rash    Mouth ulcers   PTA Medications: Prescriptions prior to admission  Medication Sig Dispense Refill Last Dose  . acetaminophen (TYLENOL) 500 MG tablet Take 500 mg by mouth every 6 (six) hours as needed for mild pain or headache.   04/19/2014 at Unknown time  . Amlodipine-Valsartan-HCTZ 10-160-12.5 MG TABS Take 1 tablet by mouth daily.    04/19/2014 at Unknown time  . esomeprazole (NEXIUM) 40 MG capsule Take 1 capsule (40 mg total) by mouth daily. 30 capsule 0 04/19/2014 at Unknown time  . propranolol (INDERAL) 10 MG tablet Take 1 tablet (10 mg total) by mouth 2 (two) times daily. For anxiety. 60 tablet 2 04/18/2014 at Unknown time  . traZODone (DESYREL) 150 MG tablet Take 1 tablet (150 mg total) by mouth at bedtime as needed for sleep. (Patient not taking: Reported on 04/19/2014) 30 tablet 3 Taking    Previous Psychotropic Medications:  Medication/Dose  Prozac and Trazodone               Substance Abuse History in the last 12 months:  Yes.    Consequences of Substance Abuse: NA  Social History:  reports that she has never smoked. She does not have any smokeless tobacco history on file. She reports that she drinks alcohol. She reports that she does not use illicit drugs. Additional Social History:  Current Place of Residence:   Health and safety inspector of Birth:  [place] Family Members: Marital Status:  Single Children:  Sons:  Daughters:  1 Relationships: Education:  [employer] Problems/Performance: Religious Beliefs/Practices: History of Abuse (Emotional/Phsycial/Sexual) Ship broker History:  None. Legal History: Hobbies/Interests:  Family History:   Family History  Problem Relation Age of Onset  . Heart attack Mother     father  . Diabetes Mother   . Hypertension Mother   . Stroke      Results for orders placed or performed during the hospital encounter of 04/19/14 (from the past 72 hour(s))  Acetaminophen level     Status: None   Collection Time: 04/19/14  4:36 PM  Result Value Ref Range   Acetaminophen (Tylenol), Serum <15.0 10 - 30 ug/mL    Comment:        THERAPEUTIC CONCENTRATIONS VARY SIGNIFICANTLY. A RANGE OF 10-30 ug/mL MAY BE AN EFFECTIVE CONCENTRATION FOR MANY PATIENTS. HOWEVER, SOME ARE BEST TREATED AT CONCENTRATIONS OUTSIDE THIS RANGE. ACETAMINOPHEN CONCENTRATIONS >150 ug/mL AT 4 HOURS AFTER INGESTION AND >50 ug/mL AT 12 HOURS AFTER INGESTION ARE OFTEN ASSOCIATED WITH TOXIC REACTIONS.   CBC     Status: None   Collection Time: 04/19/14  4:36 PM  Result Value Ref Range  WBC 8.6 4.0 - 10.5 K/uL   RBC 4.12 3.87 - 5.11 MIL/uL   Hemoglobin 13.1 12.0 - 15.0 g/dL   HCT 39.0 36.0 - 46.0 %   MCV 94.7 78.0 - 100.0 fL   MCH 31.8 26.0 - 34.0 pg   MCHC 33.6 30.0 - 36.0 g/dL   RDW 14.1 11.5 - 15.5 %   Platelets 359 150 - 400 K/uL  Comprehensive metabolic panel     Status: Abnormal   Collection Time: 04/19/14  4:36 PM  Result Value Ref Range   Sodium 140 137 - 147 mEq/L   Potassium 4.0 3.7 - 5.3 mEq/L   Chloride 99 96 - 112 mEq/L   CO2 28 19 - 32 mEq/L   Glucose, Bld 113 (H) 70 - 99 mg/dL   BUN 13 6 - 23 mg/dL   Creatinine, Ser 0.83 0.50 - 1.10 mg/dL   Calcium 9.5 8.4 - 10.5 mg/dL   Total Protein 8.4 (H) 6.0 - 8.3 g/dL   Albumin 3.9 3.5  - 5.2 g/dL   AST 19 0 - 37 U/L   ALT 19 0 - 35 U/L   Alkaline Phosphatase 127 (H) 39 - 117 U/L   Total Bilirubin 0.3 0.3 - 1.2 mg/dL   GFR calc non Af Amer 76 (L) >90 mL/min   GFR calc Af Amer 88 (L) >90 mL/min    Comment: (NOTE) The eGFR has been calculated using the CKD EPI equation. This calculation has not been validated in all clinical situations. eGFR's persistently <90 mL/min signify possible Chronic Kidney Disease.    Anion gap 13 5 - 15  Ethanol (ETOH)     Status: None   Collection Time: 04/19/14  4:36 PM  Result Value Ref Range   Alcohol, Ethyl (B) <11 0 - 11 mg/dL    Comment:        LOWEST DETECTABLE LIMIT FOR SERUM ALCOHOL IS 11 mg/dL FOR MEDICAL PURPOSES ONLY   Salicylate level     Status: Abnormal   Collection Time: 04/19/14  4:36 PM  Result Value Ref Range   Salicylate Lvl <5.9 (L) 2.8 - 20.0 mg/dL  Urine Drug Screen     Status: Abnormal   Collection Time: 04/19/14  4:59 PM  Result Value Ref Range   Opiates POSITIVE (A) NONE DETECTED   Cocaine NONE DETECTED NONE DETECTED   Benzodiazepines POSITIVE (A) NONE DETECTED   Amphetamines NONE DETECTED NONE DETECTED   Tetrahydrocannabinol NONE DETECTED NONE DETECTED   Barbiturates NONE DETECTED NONE DETECTED    Comment:        DRUG SCREEN FOR MEDICAL PURPOSES ONLY.  IF CONFIRMATION IS NEEDED FOR ANY PURPOSE, NOTIFY LAB WITHIN 5 DAYS.        LOWEST DETECTABLE LIMITS FOR URINE DRUG SCREEN Drug Class       Cutoff (ng/mL) Amphetamine      1000 Barbiturate      200 Benzodiazepine   935 Tricyclics       701 Opiates          300 Cocaine          300 THC              50    Psychological Evaluations:  Assessment:   DSM5: Schizophrenia Disorders:  NA Obsessive-Compulsive Disorders:  NA Trauma-Stressor Disorders:  NA Substance/Addictive Disorders:  Opioid Disorder - Severe (304.00) Depressive Disorders:  Major Depressive Disorder - Severe (296.23)  AXIS I:  Major Depression, Recurrent severe and Substance  Abuse  AXIS II:  Deferred AXIS III:   Past Medical History  Diagnosis Date  . Hypertension   . Anxiety and depression 08/31/2012  . Gastric ulcer    AXIS IV:  economic problems, occupational problems and other psychosocial or environmental problems AXIS V:  61-70 mild symptoms  Treatment Plan/Recommendations:  1.  Take all your medications as prescribed.              2.  Report any adverse side effects to outpatient provider.                       3.  Patient instructed to not use alcohol or illegal drugs while on prescription medicines.            4.  In the event of worsening symptoms, instructed patient to call 911, the crisis hotline or go to nearest emergency room for evaluation of symptoms.  Treatment Plan Summary: Daily contact with patient to assess and evaluate symptoms and progress in treatment Medication management Current Medications:  Current Facility-Administered Medications  Medication Dose Route Frequency Provider Last Rate Last Dose  . acetaminophen (TYLENOL) tablet 500 mg  500 mg Oral Q6H PRN Laverle Hobby, PA-C      . alum & mag hydroxide-simeth (MAALOX/MYLANTA) 200-200-20 MG/5ML suspension 30 mL  30 mL Oral Q4H PRN Laverle Hobby, PA-C      . amLODipine (NORVASC) tablet 10 mg  10 mg Oral Daily Laverle Hobby, PA-C   10 mg at 04/21/14 0804  . chlordiazePOXIDE (LIBRIUM) capsule 25 mg  25 mg Oral Q6H PRN Janett Labella, NP   25 mg at 04/21/14 0308  . chlordiazePOXIDE (LIBRIUM) capsule 25 mg  25 mg Oral TID Janett Labella, NP   25 mg at 04/21/14 1304   Followed by  . [START ON 04/22/2014] chlordiazePOXIDE (LIBRIUM) capsule 25 mg  25 mg Oral BH-qamhs Janett Labella, NP       Followed by  . [START ON 04/23/2014] chlordiazePOXIDE (LIBRIUM) capsule 25 mg  25 mg Oral Daily Sheila May Agustin, NP      . dicyclomine (BENTYL) tablet 20 mg  20 mg Oral Q6H PRN Freda Munro May Agustin, NP      . [facility]) 12 hr tablet 600 mg  600 mg Oral BID Freda Munro May  Agustin, NP      . hydrochlorothiazide (MICROZIDE) capsule 12.5 mg  12.5 mg Oral Daily Laverle Hobby, PA-C   12.5 mg at 04/21/14 0805  . hydrOXYzine (ATARAX/VISTARIL) tablet 25 mg  25 mg Oral Q6H PRN Laverle Hobby, PA-C   25 mg at 04/20/14 8756  . irbesartan (AVAPRO) tablet 150 mg  150 mg Oral Daily Laverle Hobby, PA-C   150 mg at 04/21/14 4332  . lamoTRIgine (LAMICTAL) tablet 25 mg  25 mg Oral Daily Jenne Campus, MD   25 mg at 04/21/14 1304  . loperamide (IMODIUM) capsule 2-4 mg  2-4 mg Oral PRN Freda Munro May Agustin, NP      . magnesium hydroxide (MILK OF MAGNESIA) suspension 30 mL  30 mL Oral Daily PRN Laverle Hobby, PA-C      . methocarbamol (ROBAXIN) tablet 500 mg  500 mg Oral Q8H PRN Freda Munro May Agustin, NP      . multivitamin with minerals tablet 1 tablet  1 tablet Oral Daily Freda Munro May Agustin, NP   1 tablet at 04/21/14 9518  . naproxen (NAPROSYN) tablet 500 mg  500  mg Oral BID PRN Freda Munro May Agustin, NP      . ondansetron (ZOFRAN-ODT) disintegrating tablet 4 mg  4 mg Oral Q6H PRN Janett Labella, NP      . pantoprazole (PROTONIX) EC tablet 40 mg  40 mg Oral Daily Laverle Hobby, PA-C   40 mg at 04/21/14 6295  . propranolol (INDERAL) tablet 10 mg  10 mg Oral BID Laverle Hobby, PA-C   10 mg at 04/21/14 2841  . thiamine (VITAMIN B-1) tablet 100 mg  100 mg Oral Daily Sheila May Agustin, NP   100 mg at 04/21/14 3244    Observation Level/Precautions:  15 minute checks  Laboratory:  Per ED  Psychotherapy:  Group milieu  Medications:  Se med list  Consultations:  As needed  Discharge Concerns:  safety  Estimated LOS:  2-5 days  Other:     I certify that inpatient services furnished can reasonably be expected to improve the patient's condition.   Kerrie Buffalo MAY, AGNP-BC 11/20/20152:37 PM   Patient Case reviewed- I also personally met with patient. Agree with NP's Note, Assessment, Plan. Patient is a 58 year old woman, who presents with significant depression and  anxiety. She describes a history of substance abuse and her admission UDS is positive for BZDs and Opiates. Unemployment is another major stressor. Will provide inpatient treatment and stabilization, detox if needed, and consider mood stabilizer, as she reports a history of possible hypomanic episodes in the past.

## 2014-04-21 NOTE — [facility] Group Notes (Signed)
[facility] LCSW Group Therapy 04/21/2014  1:15 PM   Type of Therapy: Group Therapy  Participation Level: Did Not Attend, patient in bed.   Tashawnda Bleiler, MSW, LCSWA Clinical Social Worker [facility] [phone]    

## 2014-04-21 NOTE — [facility] Group Notes (Signed)
Adult Psychoeducational Group Note  Date:  04/21/2014 Time:  9:50 PM  Group Topic/Focus:  AA Meeting  Participation Level:  Did Not Attend  Participation Quality:  None  Affect:  None  Cognitive:  None  Insight: None  Engagement in Group:  None  Modes of Intervention:  Discussion and Education  Additional Comments:  [name] did not attend group.  Caroll RancherLindsay, Takako Minckler A 04/21/2014, 9:50 PM

## 2014-04-21 NOTE — [facility] Suicide Risk Assessment (Signed)
[facility] INPATIENT:  Family/Significant Other Suicide Prevention Education  Suicide Prevention Education:  Education Completed; daughter Sarah Mclaughlin [phone],  (name of family member/significant other) has been identified by the patient as the family member/significant other with whom the patient will be residing, and identified as the person(s) who will aid the patient in the event of a mental health crisis (suicidal ideations/suicide attempt).  With written consent from the patient, the family member/significant other has been provided the following suicide prevention education, prior to the and/or following the discharge of the patient.  The suicide prevention education provided includes the following:  Suicide risk factors  Suicide prevention and interventions  National Suicide Hotline telephone number  [facility] assessment telephone number  [facility] Assistance 911  [facility] and/or Residential Mobile Crisis Unit telephone number  Request made of family/significant other to:  Remove weapons (e.g., guns, rifles, knives), all items previously/currently identified as safety concern.    Remove drugs/medications (over-the-counter, prescriptions, illicit drugs), all items previously/currently identified as a safety concern.  The family member/significant other verbalizes understanding of the suicide prevention education information provided.  The family member/significant other agrees to remove the items of safety concern listed above.  Sarah Mclaughlin, [place]boKristin Mclaughlin 04/21/2014, 4:07 PM

## 2014-04-21 NOTE — Progress Notes (Signed)
Pt woke up and said it felt like her blood pressure was high   She also wanted some medication to help her relax and go back to sleep   bp taken  97/76  67   Pt was given 25mg  of librium and a cup of ginger ale   Pt said she had not been dizzy or lightheaded  She said she did not realize she slept so long and feels nervous
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
## 2014-04-21 NOTE — Consult Note (Signed)
Medical Consultation   Sarah Mclaughlin  YNW:[id number]  DOB: 24-Jun-1955  DOA: 04/19/2014  PCP: Laren BoomHommel, Sean, DO  Requesting physician: Dr. Jama Flavorsobos  Reason for consultation: Labile blood pressure   History of Present Illness: This is a 58 year old female with a history of hypertension, bipolar disorder, lipid dependence [facility] major depressive disorder as well as opiate withdrawal.  Patient feels her blood pressure has been fluctuating and that she is on many hypertensive medications. Patient denies dizziness, headache, vision changes, chest pain, shortness of breath, abdominal pain. [facility] was consulted for BP management.    Allergies:   Allergies  Allergen Reactions  . Lisinopril Cough  . Penicillins Itching  . Trazodone And Nefazodone Itching    Frequent urination  . Cyclobenzaprine Rash    Mouth ulcers      Past Medical History  Diagnosis Date  . Hypertension   . Anxiety and depression 08/31/2012  . Gastric ulcer     Past Surgical History  Procedure Laterality Date  . Uterus ruptured  1998    Social History:  reports that she has never smoked. She does not have any smokeless tobacco history on file. She reports that she drinks alcohol. She reports that she does not use illicit drugs.  Family History  Problem Relation Age of Onset  . Heart attack Mother     father  . Diabetes Mother   . Hypertension Mother   . Stroke     Review of Systems:  Constitutional: Denies fever, chills, diaphoresis, appetite change and fatigue.  HEENT: Denies photophobia, eye pain, redness, hearing loss, ear pain, congestion, sore throat, rhinorrhea, sneezing, mouth sores, trouble swallowing, neck pain, neck stiffness and tinnitus.   Respiratory: Denies SOB, DOE, cough, chest tightness,  and wheezing.   Cardiovascular: Denies chest pain, palpitations and leg swelling.  Gastrointestinal: Denies nausea, vomiting, abdominal pain, diarrhea, constipation, blood in stool  and abdominal distention.  Genitourinary: Denies dysuria, urgency, frequency, hematuria, flank pain and difficulty urinating.  Musculoskeletal: Denies myalgias, back pain, joint swelling, arthralgias and gait problem.  Skin: Denies pallor, rash and wound.  Neurological: Denies dizziness, seizures, syncope, weakness, light-headedness, numbness and headaches.  Hematological: Denies adenopathy. Easy bruising, personal or family bleeding history  Psychiatric/Behavioral:Complains of anxiety and depression.   Physical Exam: Blood pressure 82/60, pulse 65, temperature 98.2 F (36.8 C), temperature source Oral, resp. rate 16, height 4\' 11"  (1.499 m), weight 65.318 kg (144 lb), SpO2 100 %.   General: Well developed, well nourished, NAD, appears stated age  HEENT: NCAT, PERRLA, EOMI, Anicteic Sclera, mucous membranes moist.   Neck: Supple, no JVD, no masses  Cardiovascular: S1 S2 auscultated, no rubs, murmurs or gallops. Regular rate and rhythm.  Respiratory: Clear to auscultation bilaterally with equal chest rise  Abdomen: Soft, nontender, nondistended, + bowel sounds  Extremities: warm dry without cyanosis clubbing or edema  Neuro: AAOx3, cranial nerves grossly intact. Strength 5/5 in patient's upper and lower extremities bilaterally  Skin: Without rashes exudates or nodules  Psych: complains of anxiety and depression  Labs on Admission:  Basic Metabolic Panel:  Recent Labs Lab 04/19/14 1636  NA 140  K 4.0  CL 99  CO2 28  GLUCOSE 113*  BUN 13  CREATININE 0.83  CALCIUM 9.5   Liver Function Tests:  Recent Labs Lab 04/19/14 1636  AST 19  ALT 19  ALKPHOS 127*  BILITOT 0.3  PROT 8.4*  ALBUMIN 3.9   No results for input(s): LIPASE, AMYLASE in the  last 168 hours. No results for input(s): AMMONIA in the last 168 hours. CBC:  Recent Labs Lab 04/19/14 1636  WBC 8.6  HGB 13.1  HCT 39.0  MCV 94.7  PLT 359   Cardiac Enzymes: No results for input(s): CKTOTAL,  CKMB, CKMBINDEX, TROPONINI in the last 168 hours. BNP: Invalid input(s): POCBNP CBG: No results for input(s): GLUCAP in the last 168 hours.  Inpatient Medications:   Scheduled Meds: . amLODipine  10 mg Oral Daily  . chlordiazePOXIDE  25 mg Oral TID   Followed by  . [START ON 04/22/2014] chlordiazePOXIDE  25 mg Oral BH-qamhs   Followed by  . [START ON 04/23/2014] chlordiazePOXIDE  25 mg Oral Daily  . guaiFENesin  600 mg Oral BID  . hydrochlorothiazide  12.5 mg Oral Daily  . irbesartan  150 mg Oral Daily  . lamoTRIgine  25 mg Oral Daily  . multivitamin with minerals  1 tablet Oral Daily  . pantoprazole  40 mg Oral Daily  . propranolol  10 mg Oral BID  . thiamine  100 mg Oral Daily   Continuous Infusions:    Radiological Exams on Admission: No results found.  Impression/Recommendations  MDD/opioid dependence -Continue per primary team  Hypertension -Blood pressures have been somewhat labile. -Would recommend discontinuing clonidine -Continue home medications: Amlodipine, HCTZ and Avapro, propranolol -Will continue to monitor blood pressures before adjusting home medications -may also be complicated by opioid withdrawal  Thank you for the consultation and allowing the sutures in the care and management of your patient.  Will continue to follow with you.  Time Spent: 60 minutes  Vandana Haman D.O. [facility] 04/21/2014, 1:46 PM
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
## 2014-04-22 DIAGNOSIS — F1120 Opioid dependence, uncomplicated: Secondary | ICD-10-CM

## 2014-04-22 DIAGNOSIS — F322 Major depressive disorder, single episode, severe without psychotic features: Secondary | ICD-10-CM

## 2014-04-22 MED ORDER — GUAIFENESIN 100 MG/5ML PO SYRP
200.0000 mg | ORAL_SOLUTION | Freq: Four times a day (QID) | ORAL | Status: DC | PRN
  Administered 2014-04-23 – 2014-04-24 (×2): 200 mg via ORAL
  Filled 2014-04-22 (×3): qty 10

## 2014-04-22 NOTE — Progress Notes (Signed)
[facility]  04/22/2014 1:09 PM Sarah Mclaughlin  MRN:  [id number] Subjective:   Patient was seen on rounds today.  Patient states that she is feeling much better.  Patient states that she started taking Lamictal today for her depression and mood.  Patient report good sleep last night and will like to receive the same sleeping pill she received last night.  Patient c/o dry cough and will like to receive medicine for her cough.   She denies fever with her dry cough. Patient participates in group and is happy she is doing better.  She denies SI/HI/AVH,  We will continue to monitor patient and administer her medications.  We will prescribe a cough syrup Robitussin for her cough. Diagnosis:   DSM5: Schizophrenia Disorders:   Obsessive-Compulsive Disorders:   Trauma-Stressor Disorders:   Substance/Addictive Disorders:  Opioid Disorder - Severe (304.00) Depressive Disorders:  Major Depressive Disorder - Severe (296.23) Total Time spent with patient: 30 minutes    ADL's:  Intact  Sleep: Good  Appetite:  Good  Suicidal Ideation:  Denies Homicidal Ideation:  Denies  Psychiatric Specialty Exam: Physical Exam  ROS  Blood pressure 105/87, pulse 67, temperature 97.5 F (36.4 C), temperature source Oral, resp. rate 18, height 4\' 11"  (1.499 m), weight 65.318 kg (144 lb), SpO2 100 %.Body mass index is 29.07 kg/(m^2).  General Appearance: Casual  Eye Contact::  Good  Speech:  Clear and Coherent and Normal Rate  Volume:  Normal  Mood:  Depressed  Affect:  Congruent and Depressed  Thought Process:  Coherent, Goal Directed and Intact  Orientation:  Full (Time, Place, and Person)  Thought Content:  WDL  Suicidal Thoughts:  No  Homicidal Thoughts:  No  Memory:  Immediate;   Good Recent;   Good Remote;   Good  Judgement:  Good  Insight:  Good  Psychomotor Activity:  Normal  Concentration:  Good  Recall:  NA  Fund of Knowledge:Good  Language: Good  Akathisia:  NA  Handed:   Right  AIMS (if indicated):     Assets:  Desire for Improvement  Sleep:  Number of Hours: 6   Musculoskeletal: Strength & Muscle Tone: within normal limits Gait & Station: normal Patient leans: N/A  Current Medications: Current Facility-Administered Medications  Medication Dose Route Frequency Provider Last Rate Last Dose  . acetaminophen (TYLENOL) tablet 500 mg  500 mg Oral Q6H PRN Kerry HoughSpencer E Simon, PA-C      . alum & mag hydroxide-simeth (MAALOX/MYLANTA) 200-200-20 MG/5ML suspension 30 mL  30 mL Oral Q4H PRN Kerry HoughSpencer E Simon, PA-C      . amLODipine (NORVASC) tablet 10 mg  10 mg Oral Daily Kerry HoughSpencer E Simon, PA-C   10 mg at 04/21/14 0804  . chlordiazePOXIDE (LIBRIUM) capsule 25 mg  25 mg Oral Q6H PRN Lindwood QuaSheila May Agustin, NP   25 mg at 04/21/14 0308  . chlordiazePOXIDE (LIBRIUM) capsule 25 mg  25 mg Oral BH-qamhs Sheila May Agustin, NP   25 mg at 04/22/14 0813   Followed by  . [START ON 04/23/2014] chlordiazePOXIDE (LIBRIUM) capsule 25 mg  25 mg Oral Daily Sheila May Agustin, NP      . dicyclomine (BENTYL) tablet 20 mg  20 mg Oral Q6H PRN Lindwood QuaSheila May Agustin, NP      . guaiFENesin [facility](MUCINEX) 12 hr tablet 600 mg  600 mg Oral BID Velna HatchetSheila May Agustin, NP   600 mg at 04/22/14 0809  . hydrOXYzine (ATARAX/VISTARIL) tablet 25 mg  25 mg  Oral Q6H PRN Kerry HoughSpencer E Simon, PA-C   25 mg at 04/20/14 [id number]  . irbesartan (AVAPRO) tablet 150 mg  150 mg Oral Daily Kerry HoughSpencer E Simon, PA-C   150 mg at 04/22/14 [id number]  . lamoTRIgine (LAMICTAL) tablet 25 mg  25 mg Oral Daily Craige CottaFernando A Cobos, MD   25 mg at 04/21/14 1304  . loperamide (IMODIUM) capsule 2-4 mg  2-4 mg Oral PRN Velna HatchetSheila May Agustin, NP      . magnesium hydroxide (MILK OF MAGNESIA) suspension 30 mL  30 mL Oral Daily PRN Kerry HoughSpencer E Simon, PA-C      . methocarbamol (ROBAXIN) tablet 500 mg  500 mg Oral Q8H PRN Velna HatchetSheila May Agustin, NP      . multivitamin with minerals tablet 1 tablet  1 tablet Oral Daily Sheila May Agustin, NP   1 tablet at 04/21/14 [id number]  . naproxen  (NAPROSYN) tablet 500 mg  500 mg Oral BID PRN Velna HatchetSheila May Agustin, NP      . ondansetron (ZOFRAN-ODT) disintegrating tablet 4 mg  4 mg Oral Q6H PRN Lindwood QuaSheila May Agustin, NP      . pantoprazole (PROTONIX) EC tablet 40 mg  40 mg Oral Daily Kerry HoughSpencer E Simon, PA-C   40 mg at 04/21/14 [id number]  . propranolol (INDERAL) tablet 10 mg  10 mg Oral BID Kerry HoughSpencer E Simon, PA-C   10 mg at 04/21/14 [id number]  . temazepam (RESTORIL) capsule 7.5 mg  7.5 mg Oral QHS PRN Lindwood QuaSheila May Agustin, NP   7.5 mg at 04/21/14 2248  . thiamine (VITAMIN B-1) tablet 100 mg  100 mg Oral Daily Sheila May Agustin, NP   100 mg at 04/21/14 [id number]    Lab Results: No results found for this or any previous visit (from the past 48 hour(s)).  Physical Findings: AIMS:  , ,  ,  ,    CIWA:  CIWA-Ar Total: 0 COWS:  COWS Total Score: 1  Treatment Plan Summary: Daily contact with patient to assess and evaluate symptoms and progress in treatment Medication management  Plan: Continue with plan of care Continue crisis management Encourage to participate in group and individual sessions Continue medication management/ and review as needed Discharge  Plan in progress Address health issues /V/S as needed    Medical Decision Making Problem Points:  Established problem, stable/improving (1) Data Points:  Review and summation of old records (2) Review of medication regiment & side effects (2)  I certify that inpatient services furnished can reasonably be expected to improve the patient's condition.   Dahlia ByesONUOHA, Kanyah Matsushima, C    PMHNP-BC  04/22/2014, 1:09 PM

## 2014-04-22 NOTE — Progress Notes (Signed)
Adult Psychoeducational Group Note  Date:  04/22/2014 Time:  2:23 PM  Group Topic/Focus:  Therapeutic Activity   Participation Level:  Active  Participation Quality:  Appropriate, Sharing and Supportive  Affect:  Appropriate  Cognitive:  Appropriate  Insight: Appropriate  Engagement in Group:  Engaged and Supportive  Modes of Intervention:  Activity, Discussion, Socialization and Support   Elijio MilesMercer, Jin Shockley N 04/22/2014, 2:23 PM

## 2014-04-22 NOTE — Progress Notes (Addendum)
BP still labile. Will hold HCTZ and reassess in AM. Pt is still on Norvasc, Avapro, Propranolol.  If BP remains low, next medication to hold would be Norvasc. Will sign off for now and please call us if you have any questions.   Debbora PrestoMAGICK-Yael Angerer, MD  [facility] Pager [phone] Cell [phone]  If 7PM-7AM, please contact night-coverage [email or web address] Password [email or web address]

## 2014-04-22 NOTE — Progress Notes (Signed)
D: Pt denies SI/HI/AVH. Pt is pleasant and cooperative. Pt worried about quitting job. Pt believes the company acted unfair, but not wanting to pay for her to get psychiatric help while being employed with them. Pt still denies any specific stressor to having SI feelings before coming in.   A: Pt was offered support and encouragement. Pt was given scheduled medications. Pt was encourage to attend groups. Q 15 minute checks were done for safety.   R: Pt is taking medication. Pt has no complaints at this time.Pt receptive to treatment and safety maintained on unit.

## 2014-04-22 NOTE — [facility] Group Notes (Signed)
[facility] LCSW Group Therapy  04/22/2014 12:34 PM  Type of Therapy:  Group Therapy  Participation Level:  Minimal  Participation Quality:  Attentive  Affect:  Appropriate  Cognitive:  Appropriate  Insight:  Limited  Engagement in Therapy:  Improving  Modes of Intervention:  Discussion  Summary of Progress/Problems: Patient participated in group today during which the discussion was about coping strategies. In group we discussed what are negative coping strategies and how we have developed them over the years. Then processed examples of positive coping mechanisms.  The group then processed how to use positive attributes in order to develop their copingmechanisms. Group proceded through discussion and open dialogue.   Beverly SessionsLINDSEY, Jayren Cease J 04/22/2014, 12:34 PM

## 2014-04-22 NOTE — Progress Notes (Signed)
Patient ID: Sarah Mclaughlin, female   DOB: [birth year], 58 y.o.   MRN: [id number] D: Patient refused most of her medication this morning stating that her "doctor was supposed to discontinue them."  Patient took her mucinex and ibestarin and refused the rest.  Her blood pressure was stable this morning and she felt she didn't need "all that blood pressure medication."  Patient rates her depression and anxiety as a 6; she denies any hopelessness. She denies any SI/HI/AVH.  She is complaining of a cough and sore throat.  She wrote on her self inventory, "there will always be failures and I must never give up."  "I know that I'm never alone I have help."  "Please help me to find the right medicine and help me to feel alive again."  Patient is up in the milieu and is attending groups. A: Continue to monitor medication management and MD orders.  Safety checks completed every 15 minutes per protocol. R: Patient's behavior is appropriate to situation.

## 2014-04-22 NOTE — Progress Notes (Signed)
D.  Pt pleasant on approach, states she is feeling much better, denies withdrawal symptoms at this time.  Denies SI/HI/hallucinations currently.  Interacting appropriately with peers on the unit.  Positive for evening AA group.  A.  Support and encouragement offered  R.  Pt remains safe on unit, will continue to monitor.

## 2014-04-22 NOTE — Plan of Care (Signed)
Problem: Alteration in mood Goal: LTG-Patient reports reduction in suicidal thoughts (Patient reports reduction in suicidal thoughts and is able to verbalize a safety plan for whenever patient is feeling suicidal)  Outcome: Progressing Pt denied SI tonight Goal: STG-Patient is able to discuss feelings and issues (Patient is able to discuss feelings and issues leading to depression)  Outcome: Progressing Pt stated she feels a little sad, but feels un easy due to situation with ex-BF, and her job

## 2014-04-23 DIAGNOSIS — F1110 Opioid abuse, uncomplicated: Secondary | ICD-10-CM

## 2014-04-23 DIAGNOSIS — F332 Major depressive disorder, recurrent severe without psychotic features: Principal | ICD-10-CM

## 2014-04-23 NOTE — [facility] Group Notes (Signed)
[facility] Group Notes:  (Nursing/MHT/Case Management/Adjunct)  Date:  04/23/2014  Time:  3:39 PM  Type of Therapy:  Nurse Education  Participation Level:  Minimal  Participation Quality:  Attentive  Affect:  Flat  Cognitive:  Alert  Insight:  Appropriate  Engagement in Group:  Engaged  Modes of Intervention:  Discussion and Education  Summary of Progress/Problems:  The purpose of this group is to follow up on earlier expressed concerns and rules. Patient attended group and was attentive.  Marzetta BoardDopson, Nyaire Denbleyker E 04/23/2014, 3:39 PM

## 2014-04-23 NOTE — Progress Notes (Addendum)
Patient up and visible in the milieu. Attitude somewhat negative and patient complaining about various things related to unit rules. Does not appear to be willing to engage in problem solving. Patient refused all BP meds this morning as well as her congestion meds. Patient would only take lamictal. She states, "my blood pressure is fine this morning so I don't need those meds. And I don't have any congestion right now." Suggested she speak with provider(s) regarding her concerns but also stated BP could rise again without the use of meds. Pt given support, encouragement. Medicated with lamictal and later in the day, patient requested and was given robitussin for her congestion. Will eval effectiveness. Denies SI/HI and states her goal is to look at discharge however then rates the following: depression 5/10, hopelessness 2/10 and anxiety 6/10. Will continue to monitor patient. Lawrence MarseillesFriedman, Seanne Chirico Eakes

## 2014-04-23 NOTE — Progress Notes (Signed)
Patient continues to voice frequent needs. States the robitussin helped her with her congestion however it made her sick to her stomach. Continues to refuse most of her meds (for physical symptoms). BP was checked in the evening and is stable (see doc flowsheet). Will continue to support patient. She remains safe. Lawrence MarseillesFriedman, Dashley Monts Eakes

## 2014-04-23 NOTE — [facility] Group Notes (Deleted)
[facility]  04/23/2014 2:56 PM  Type of Therapy:  Group Therapy  Participation Level:  Did Not Attend   Beverly SessionsLINDSEY, Sarah Mclaughlin 04/23/2014, 2:56 PM

## 2014-04-23 NOTE — Progress Notes (Signed)
Patient observed vomiting in room after dinner. She reports vomiting after meals today however has not reported this to staff. Unclear if patient is having withdrawal - she denies - as UDS was + opiates and benzodiazepines and admitted to doc shopping prior to admit. Zofran 4mg  ODT given and patient instructed to stay in room and utilize strict hand washing. Encouraged to not eat this evening and progress slowly with fluids. Will pass to night RN to re-evaluate. Lawrence MarseillesFriedman, Niyanna Asch Eakes

## 2014-04-23 NOTE — [facility] Group Notes (Signed)
[facility] LCSW Group Therapy  04/23/2014 3:01 PM  Type of Therapy:  Group Therapy  Participation Level:  Active  Participation Quality:  Appropriate, Sharing and Supportive  Affect:  Appropriate  Cognitive:  Appropriate  Insight:  Improving  Engagement in Therapy:  Engaged  Modes of Intervention:  Discussion  Summary of Progress/Problems: Group topic was about developing strong support systems.  This group began by discussing the difference between supports and enablers.  Group was able to clearly define supports as positive and enablers as negative.  With some time the group could further identify that the difference between enablers and supporters is the patient. Group empowered patient to see themselves as the leader of their own team and empowered to create appropriate supports from those who had previously been enablers.   Beverly SessionsLINDSEY, Tacha Manni J 04/23/2014, 3:01 PM

## 2014-04-23 NOTE — Progress Notes (Addendum)
Patient ID: Sarah Mclaughlin, female   DOB: 09/14/1955, 58 y.o.   MRN: [id number] [facility]  04/23/2014 10:56 AM Sarah Mclaughlin  MRN:  [id number] Subjective:    Patient was seen on rounds today.  Patient states that she is feeling much better.  Patient states that she started taking Lamictal for 2 days now for her depression and mood.  Patient reports still good sleep last night and would like to stay on the same dose of Vistaril. Patient stated that  dry cough has improved.  Mucinex has improved it.  She denies fever with her dry cough. Patient participates in group and is happy she is doing better.  She denies SI/HI/AVH,  We will continue to monitor patient and administer her medications.  Robitussin for her cough has been ordered PRN and informed patient that this was available to her as needed.    Diagnosis:   DSM5: Schizophrenia Disorders:   Obsessive-Compulsive Disorders:   Trauma-Stressor Disorders:   Substance/Addictive Disorders:  Opioid Disorder - Severe (304.00) Depressive Disorders:  Major Depressive Disorder - Severe (296.23) Total Time spent with patient: 30 minutes  ADL's:  Intact  Sleep: Good  Appetite:  Good  Suicidal Ideation:  Denies Homicidal Ideation:  Denies  Psychiatric Specialty Exam: Physical Exam  ROS  Blood pressure 134/86, pulse 66, temperature 98.7 F (37.1 C), temperature source Oral, resp. rate 18, height 4\' 11"  (1.499 m), weight 65.318 kg (144 lb), SpO2 100 %.Body mass index is 29.07 kg/(m^2).  General Appearance: Casual  Eye Contact::  Good  Speech:  Clear and Coherent and Normal Rate  Volume:  Normal  Mood:  Depressed, improving  Affect:  Less anxious, more jovial and light  Thought Process:  Coherent, Goal Directed and Intact  Orientation:  Full (Time, Place, and Person)  Thought Content:  WDL  Suicidal Thoughts:  No  Homicidal Thoughts:  No  Memory:  Immediate;   Good Recent;   Good Remote;   Good  Judgement:  Good   Insight:  Good  Psychomotor Activity:  Normal  Concentration:  Good  Recall:  NA  Fund of Knowledge:Good  Language: Good  Akathisia:  NA  Handed:  Right  AIMS (if indicated):     Assets:  Desire for Improvement  Sleep:  Number of Hours: 6   Musculoskeletal: Strength & Muscle Tone: within normal limits Gait & Station: normal Patient leans: N/A  Current Medications: Current Facility-Administered Medications  Medication Dose Route Frequency Provider Last Rate Last Dose  . acetaminophen (TYLENOL) tablet 500 mg  500 mg Oral Q6H PRN Kerry HoughSpencer E Simon, PA-C      . alum & mag hydroxide-simeth (MAALOX/MYLANTA) 200-200-20 MG/5ML suspension 30 mL  30 mL Oral Q4H PRN Kerry HoughSpencer E Simon, PA-C      . amLODipine (NORVASC) tablet 10 mg  10 mg Oral Daily Kerry HoughSpencer E Simon, PA-C   10 mg at 04/21/14 0804  . chlordiazePOXIDE (LIBRIUM) capsule 25 mg  25 mg Oral Daily Lindwood QuaSheila May Koty Anctil, NP   25 mg at 04/23/14 0800  . dicyclomine (BENTYL) tablet 20 mg  20 mg Oral Q6H PRN Lindwood QuaSheila May Esbeidy Mclaine, NP      . guaiFENesin [facility](MUCINEX) 12 hr tablet 600 mg  600 mg Oral BID Velna HatchetSheila May Yon Schiffman, NP   600 mg at 04/22/14 1653  . guaifenesin (ROBITUSSIN) 100 MG/5ML syrup 200 mg  200 mg Oral Q6H PRN Earney NavyJosephine C Onuoha, NP      . hydrOXYzine (ATARAX/VISTARIL) tablet 25 mg  25 mg Oral Q6H PRN Kerry HoughSpencer E Simon, PA-C   25 mg at 04/23/14 0140  . irbesartan (AVAPRO) tablet 150 mg  150 mg Oral Daily Kerry HoughSpencer E Simon, PA-C   150 mg at 04/22/14 [id number]  . lamoTRIgine (LAMICTAL) tablet 25 mg  25 mg Oral Daily Craige CottaFernando A Cobos, MD   25 mg at 04/23/14 0855  . loperamide (IMODIUM) capsule 2-4 mg  2-4 mg Oral PRN Velna HatchetSheila May Shefali Ng, NP      . magnesium hydroxide (MILK OF MAGNESIA) suspension 30 mL  30 mL Oral Daily PRN Kerry HoughSpencer E Simon, PA-C      . methocarbamol (ROBAXIN) tablet 500 mg  500 mg Oral Q8H PRN Velna HatchetSheila May Arick Mareno, NP   500 mg at 04/22/14 2113  . multivitamin with minerals tablet 1 tablet  1 tablet Oral Daily Desmin Daleo May Javone Ybanez, NP   1  tablet at 04/21/14 [id number]  . naproxen (NAPROSYN) tablet 500 mg  500 mg Oral BID PRN Lindwood QuaSheila May Zamari Vea, NP      . ondansetron (ZOFRAN-ODT) disintegrating tablet 4 mg  4 mg Oral Q6H PRN Lindwood QuaSheila May Tyeasha Ebbs, NP      . pantoprazole (PROTONIX) EC tablet 40 mg  40 mg Oral Daily Kerry HoughSpencer E Simon, PA-C   40 mg at 04/21/14 [id number]  . temazepam (RESTORIL) capsule 7.5 mg  7.5 mg Oral QHS PRN Lindwood QuaSheila May Wynetta Seith, NP   7.5 mg at 04/22/14 2113  . thiamine (VITAMIN B-1) tablet 100 mg  100 mg Oral Daily Ermine Stebbins May Norah Devin, NP   100 mg at 04/21/14 [id number]    Lab Results: No results found for this or any previous visit (from the past 48 hour(s)).  Physical Findings: AIMS:  , ,  ,  ,    CIWA:  CIWA-Ar Total: 1 COWS:  COWS Total Score: 1  Treatment Plan Summary: Daily contact with patient to assess and evaluate symptoms and progress in treatment Medication management  Plan: Continue with plan of care Continue crisis management Encourage to participate in group and individual sessions Continue medication management/ and review as needed Discharge  Plan in progress Address health issues /V/S as needed.   BP is stable, will continue to monitor for low BP.   Propanolol dc'd.  Will continue Amlopidine 10 mg QD and Vistaril 25 mg for PRN anxiety. Will continue Lamictal 25 mg.    Medical Decision Making Problem Points:  Established problem, stable/improving (1) Data Points:  Review and summation of old records (2) Review of medication regiment & side effects (2)  I certify that inpatient services furnished can reasonably be expected to improve the patient's condition.   Adonis BrookAGUSTIN, Zeta Bucy MAY,  AGNP-BC 04/23/2014, 10:56 AM

## 2014-04-23 NOTE — Plan of Care (Signed)
Problem: Ineffective individual coping Goal: STG: Patient will remain free from self harm Outcome: Progressing Patient denies SI and has not engaged in self harm.  Problem: Diagnosis: Increased Risk For Suicide Attempt Goal: STG-Patient Will Comply With Medication Regime Outcome: Not Progressing Patient only taking a few meds that are ordered - most pertaining to her respiratory complaints and BP.

## 2014-04-23 NOTE — Progress Notes (Signed)
Pt did not attend NA group this evening.  

## 2014-04-23 NOTE — [facility] Group Notes (Signed)
[facility] Group Notes:  (Nursing/MHT/Case Management/Adjunct)  Date:  04/23/2014  Time:  9:00am  Type of Therapy:  Nurse Education  Participation Level:  Active  Participation Quality:  Redirectable  Affect:  Defensive  Cognitive:  Alert and Oriented  Insight:  Lacking  Engagement in Group:  Limited  Modes of Intervention:  Clarification, Education and Problem-solving  Summary of Progress/Problems: Reviewed treatment agreement with patients as well as unit expectations and appropriate behavior. Patient upset stating her glasses were taken away from her therefore during admission she doesn't know what she signed. Family was able to bring glasses in during her stay here and patient was reminded and encouraged to speak up when she has needs - that staff will gladly read forms to her to aid in understanding.   Sarah Mclaughlin, Sarah Mclaughlin 04/23/2014, 1:21 PM

## 2014-04-24 ENCOUNTER — Ambulatory Visit (HOSPITAL_COMMUNITY): Payer: Self-pay | Admitting: Psychiatry

## 2014-04-24 DIAGNOSIS — F332 Major depressive disorder, recurrent severe without psychotic features: Secondary | ICD-10-CM | POA: Insufficient documentation

## 2014-04-24 MED ORDER — LAMOTRIGINE 25 MG PO TABS: 25.0000 mg | ORAL_TABLET | Freq: Every day | ORAL | Status: DC

## 2014-04-24 MED ORDER — TEMAZEPAM 7.5 MG PO CAPS: 7.5000 mg | ORAL_CAPSULE | Freq: Every evening | ORAL | Status: DC | PRN

## 2014-04-24 MED ORDER — ESOMEPRAZOLE MAGNESIUM 40 MG PO CPDR: 40.0000 mg | DELAYED_RELEASE_CAPSULE | Freq: Every day | ORAL | Status: DC

## 2014-04-24 MED ORDER — METHOCARBAMOL 500 MG PO TABS: 500.0000 mg | ORAL_TABLET | Freq: Three times a day (TID) | ORAL | Status: AC | PRN

## 2014-04-24 MED ORDER — AMLODIPINE-VALSARTAN-HCTZ 10-160-12.5 MG PO TABS: 1.0000 | ORAL_TABLET | Freq: Every day | ORAL | Status: DC

## 2014-04-24 MED ORDER — ACETAMINOPHEN 500 MG PO TABS: 500.0000 mg | ORAL_TABLET | Freq: Four times a day (QID) | ORAL | Status: AC | PRN

## 2014-04-24 NOTE — [facility] Group Notes (Signed)
[facility]  04/24/2014  8:45 AM  Participation Quality: Did Not Attend- patient meeting with psychiatrist.  Samuella BruinKristin Eveleen Mcnear, MSW, [facility] CenterCSWA Clinical Social Worker [facility] [phone]

## 2014-04-24 NOTE — Progress Notes (Signed)
D   Pt complained of flu like symptoms this evening  She mostly stayed in her room but did come to the nurses station for medications    A   Verbal support given   Medications administered and effectiveness monitored   Q 15 min checks   Encouraged fluids  R  Pt receptive and safe at present

## 2014-04-24 NOTE — Clinical Social Work Note (Signed)
CSW met with patient to discuss discharge plans. Patient reports feeling ready to discharge home to follow up with [facility]. CSW provided patient with upcoming appointment dates. Patient had improved and appropriate affect, denies SI/HI at this time. Patient reports that her daughter or son-in-law will provide transportation. Patient had no further questions or concerns.   , MSW, LCSWA Clinical Social Worker [facility] [phone]  

## 2014-04-24 NOTE — Progress Notes (Signed)
Patient ID: Sarah Mclaughlin, female   DOB: 1955-11-16, 58 y.o.   MRN: [id number] She has been discharged home and was picked up by her Son- In -Law.  She voiced understanding of discharge teaching about  Medications and follow up care. She denies SI thoughts and all belongings were taken home with her. She did not fill out her self inventory today.

## 2014-04-24 NOTE — Discharge Summary (Signed)
Physician Discharge Summary Note  Patient:  Sarah Mclaughlin is an 58 y.o., female MRN:  [id number] DOB:  [birth year] Patient phone:  [phone] (home)  Patient address:   [address] [place] [address],  Total Time spent with patient: 30 minutes  Date of Admission:  04/19/2014 Date of Discharge: 04/24/2014  Reason for Admission:  depression  Discharge Diagnoses: Active Problems:   MDD (major depressive disorder), recurrent episode, severe   Opioid dependence with opioid-induced mood disorder   Major depressive disorder, recurrent, severe without psychotic features  Psychiatric Specialty Exam: Physical Exam  Vitals reviewed. Psychiatric: She has a normal mood and affect. Her behavior is normal. Judgment and thought content normal.    Review of Systems  Constitutional: Negative.   HENT: Negative.   Eyes: Negative.   Respiratory: Negative.   Cardiovascular: Negative.   Gastrointestinal: Negative.   Genitourinary: Negative.   Musculoskeletal: Negative.   Skin: Negative.   Neurological: Negative.   Endo/Heme/Allergies: Negative.   Psychiatric/Behavioral: Positive for depression (hx of, chronic, stabilized). Negative for suicidal ideas, hallucinations, memory loss and substance abuse. The patient is nervous/anxious (hx of, stabilized). The patient does not have insomnia.     Blood pressure 155/101, pulse 71, temperature 98.1 F (36.7 C), temperature source Oral, resp. rate 18, height 4\' 11"  (1.499 m), weight 65.318 kg (144 lb), SpO2 100 %.Body mass index is 29.07 kg/(m^2).   AXIS I: Major Depression, Recurrent severe and Substance Abuse AXIS II: Deferred AXIS III:  Past Medical History  Diagnosis Date  . Hypertension   . Anxiety and depression 08/31/2012  . 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 She has a history of depression with psychosis as well as a history of substance abuse. She was evaluated earlier by LCSW for her symptoms of depression which have been worsening for several months, and was worked in today on my schedule due to the daughter's reports of increasing confusion and erratic behaviors.  Patient stated that she felt like coming out of her skin, she was depressed and worried about job loss and inability to pay her bills. Furthermore, she verbalized that she is does not want to be a burden to her children during this time. She has had thoughts of suicide and has now contemplated how to do this, including using razor blades, over dosing, and drinking alcohol. She lives alone and states this is the worst her depression has ever been. Sarah Mclaughlin admits openly, "I'm an addict!" and reports that she has been going to several different doctors for medications, she reports sometimes the complaints are real and other times the complaints are made up to get the medication. She also reports that she has several friends who she can get medication from.  Sarah Mclaughlin was admitted for inpatient treatment and with crisis management.  Medication management included Lamictal 25 mg, Vistaril 25 mg for PRN anxiety and Restoril 7.5 mg for sleep.  Other health issues addressed and medications resumed, Amlopidine 10 mg for HTN.  Propanolol PRN agitation was dc'd due to low BP.  Patient was encouraged to participate in group and individual sessions.  Daily monitoring for medication efficacy and progress of care plan.  She was encouraged to follow up with [facility] in [place] for outpatient treatment and continuation of medication management.   Consults:  psychiatry  Significant Diagnostic Studies:  labs: per ED  Discharge Vitals:   Blood pressure 155/101, pulse 71, temperature 98.1 F (36.7 C), temperature source Oral,  resp. rate 18, height 4\' 11"  (1.499 m), weight 65.318 kg (144 lb), SpO2 100 %. Body mass index is 29.07 kg/(m^2). Lab Results:   No results found for this or any previous visit (from the past 72 hour(s)).  Physical Findings: AIMS:  , ,  ,  ,    CIWA:  CIWA-Ar Total: 1 COWS:  COWS Total Score: 1  Psychiatric Specialty Exam: See Psychiatric Specialty Exam and Suicide Risk Assessment completed by Attending Physician prior to discharge.  Discharge destination:  Home  Is patient on multiple antipsychotic therapies at discharge:  No   Has Patient had three or more failed trials of antipsychotic monotherapy by history:  No  Recommended Plan for Multiple Antipsychotic Therapies: NA     Medication List    STOP taking these medications        propranolol 10 MG tablet  Commonly known as:  INDERAL     traZODone 150 MG tablet  Commonly known as:  DESYREL      TAKE these medications      Indication   acetaminophen 500 MG tablet  Commonly known as:  TYLENOL  Take 1 tablet (500 mg total) by mouth every 6 (six) hours as needed for mild pain or headache.   Indication:  Fever, Pain     Amlodipine-Valsartan-HCTZ 10-160-12.5 MG Tabs  Take 1 tablet by mouth daily.   Indication:  High Blood Pressure     esomeprazole 40 MG capsule  Commonly known as:  NEXIUM  Take 1 capsule (40 mg total) by mouth daily.   Indication:  Heartburn, GERD     lamoTRIgine 25 MG tablet  Commonly known as:  LAMICTAL  Take 1 tablet (25 mg total) by mouth daily.   Indication:  Mood Stabilization     methocarbamol 500 MG tablet  Commonly known as:  ROBAXIN  Take 1 tablet (500 mg total) by mouth every 8 (eight) hours as needed for muscle spasms.   Indication:  Musculoskeletal Pain     temazepam 7.5 MG capsule  Commonly known as:  RESTORIL  Take 1 capsule (7.5 mg total) by mouth at bedtime as needed for sleep.   Indication:  Trouble Sleeping       Follow-up Information    Follow up with [facility] Outpatient- Kath[place] Sharper On 05/04/2014.   Why:  Therapy appointment with Waldron Session on Thursday Dec. 3rd at 1pm. Please call office if you need to reschedule.   Contact information:   [address] [place], [place] [address] Phone: [phone] Fax: [phone]      Follow up with [facility] Outpatient- [place] On 05/05/2014.   Why:  Medication management appointment with Verne Spurr, P.A. on Friday Dec. 4th at 11 am. Please call office if you need to reschedule.   Contact information:   [address] [place], [place] [address] Phone: [phone] Fax: [phone]      Follow-up recommendations:  Activity:  as tol, diet as tol  Comments:  Take all medications as prescribed. Keep all follow-up appointments as scheduled.  Do not consume alcohol or use illegal drugs while on prescription medications. Report any adverse effects from your medications to your primary care provider promptly.  In the event of recurrent  symptoms or worsening symptoms, call 911, a crisis hotline, or go to the nearest emergency department for evaluation.   Total Discharge Time:  Greater than 30 minutes.  SignedAdonis Brook: AGUSTIN, SHEILA MAY, AGNP-BC 04/24/2014, 2:37 PM   Patient seen, Suicide Assessment Completed.  Disposition Plan Reviewed

## 2014-04-24 NOTE — [facility] Suicide Risk Assessment (Addendum)
Demographic Factors:  58 year old caucasian female, has one adult daughter, currently unemployed  Total Time spent with patient: 30 minutes  Psychiatric Specialty Exam: Physical Exam  ROS  Blood pressure 155/101, pulse 71, temperature 98.1 F (36.7 C), temperature source Oral, resp. rate 18, height 4\' 11"  (1.499 m), weight 65.318 kg (144 lb), SpO2 100 %.Body mass index is 29.07 kg/(m^2).  General Appearance: improved grooming  Eye Contact::  Good  Speech:  Normal Rate  Volume:  Normal  Mood:  reported as improved, range of affect is improved as well- she does seem somewhat anxious  Affect:  improved but some lingering anxiety  Thought Process:  Linear  Orientation:  Other:  fully alert and attentive  Thought Content:  denies hallucinations, no delusions  Suicidal Thoughts:  No at this time denies any thoughts of hurting self and  contracts for safety on unit   Homicidal Thoughts:  No  Memory:  recent and remote grossly intact  Judgement:  Fair  Insight:  Fair  Psychomotor Activity:  Normal  Concentration:  Good  Recall:  Good  Fund of Knowledge:Good  Language: Good  Akathisia:  No  Handed:  Right  AIMS (if indicated):     Assets:  Desire for Improvement Resilience  Sleep:  Number of Hours: 6    Musculoskeletal: Strength & Muscle Tone: within normal limits Gait & Station: normal Patient leans: N/A   Mental Status Per Nursing Assessment::   On Admission:  Suicidal ideation indicated by patient, Self-harm thoughts, Self-harm behaviors  Current Mental Status by Physician: Although patient remains somewhat anxious, she is feeling " much better" than upon admission, and she is denying any severe depression at this time. At present she is not suicidal or homicidal or psychotic. She has been tolerating Lamictal trial well, without any side effects or rash thus far. Behavior on unit in good control.  Loss Factors:  unemployment,/ loss of job , financial concerns    Historical Factors: Reports a possible history of hypomanic episodes and a history of multiple failures and /or poor tolerance to different antidepressant trials in the past. She reports a prior suicidal attempt in 2013  Risk Reduction Factors:   Sense of responsibility to family, Positive social support and Positive coping skills or problem solving skills  Continued Clinical Symptoms:  Much improved compared to admission and at this time not endorsing significant depression. She does report some lingering anxiety. She is not suicidal or homicidal or psychotic at this time and is future oriented.  Cognitive Features That Contribute To Risk:  No gross cognitive deficits noted upon discharge. Is alert , attentive, and oriented x 3   Suicide Risk:  Mild:  Suicidal ideation of limited frequency, intensity, duration, and specificity.  There are no identifiable plans, no associated intent, mild dysphoria and related symptoms, good self-control (both objective and subjective assessment), few other risk factors, and identifiable protective factors, including available and accessible social support.  Discharge Diagnoses:   AXIS I: Major Depression, Recurrent severe and Substance Abuse AXIS II:  Deferred AXIS III:   Past Medical History  Diagnosis Date  . Hypertension   . Anxiety and depression 08/31/2012  . Gastric ulcer    AXIS IV:  economic problems and occupational problems AXIS V:  60-65 upon discharge  Plan Of Care/Follow-up recommendations:  Activity:  As tolerated Diet:  Low sodium and heart healthy Tests:  NA Other:  See below  Is patient on multiple antipsychotic therapies at discharge:  No   Has Patient had three or more failed trials of antipsychotic monotherapy by history:  No  Recommended Plan for Multiple Antipsychotic Therapies: NA  Patient states she is leaving unit in good spirits. She is looking forward to reuniting with family Follow up as below- [facility]  [facility] [place]2014.    Why: Therapy appointment with Waldron SessionSarah Soloman on Thursday Dec. 3rd at 1pm. Please call office if you need to reschedule.   Contact information:   [address] [place], [place] [address] Phone: [phone] Fax: [phone]      [facility] On 05/05/2014.   Why: Medication management appointment with Verne SpurrNeil Mashburn, P.A. on Friday Dec. 4th at 11 am. Please call office if you need to reschedule.   Contact information:   [address] [place], [place] [address] Phone: [phone] Fax: [phone]  She also has an established PCP for medical issues as needed- Lajuana MatteJan Daniels in [place], [place] * Prior to discharge we reviewed lamictal side effect profile, to include risk of severe rash. Taris Galindo 04/24/2014, 11:01 AM

## 2014-04-24 NOTE — Progress Notes (Signed)
[facility] :  Will you be returning to the same living situation after discharge: Yes,  patient will return home At discharge, do you have transportation home?:Yes,  patient reports that her daughter or son-in-law will provide transportation Do you have the ability to pay for your medications:Yes,  patient will be provided with prescriptions at discharge  Release of information consent forms completed and in the chart;  Patient's signature needed at discharge.  Patient to Follow up at: Follow-up Information    [facility] [place] On 05/04/2014.   Why:  Therapy appointment with Waldron SessionSarah Soloman on Thursday Dec. 3rd at 1pm. Please call office if you need to reschedule.   Contact information:   [address] [place], [place] [address] Phone: [phone] Fax: [phone]      [facility] [place] On 05/05/2014.   Why:  Medication management appointment with Verne SpurrNeil Mashburn, P.A. on Friday Dec. 4th at 11 am. Please call office if you need to reschedule.   Contact information:   [address] [place], [place] [address] Phone: [phone] Fax: [phone]      Patient denies SI/HI:   Yes,  denies    Safety Planning and Suicide Prevention discussed:  Yes,  with patient and daughter  Sarah Mclaughlin, Sarah Mclaughlin 04/24/2014, 10:51 AM

## 2014-04-24 NOTE — [facility] Group Notes (Signed)
[facility] LCSW Group Therapy 04/24/2014  1:15 pm  Type of Therapy: Group Therapy Participation Level: Active  Participation Quality: Attentive, Sharing and Supportive  Affect: Appropriate, anxious  Cognitive: Alert and Oriented  Insight: Developing/Improving and Engaged  Engagement in Therapy: Developing/Improving and Engaged  Modes of Intervention: Clarification, Confrontation, Discussion, Education, Exploration,  Limit-setting, Orientation, Problem-solving, Rapport Building, Dance movement psychotherapisteality Testing, Socialization and Support  Summary of Progress/Problems: Pt identified obstacles faced currently and processed barriers involved in overcoming these obstacles. Pt identified steps necessary for overcoming these obstacles and explored motivation (internal and external) for facing these difficulties head on. Pt further identified one area of concern in their lives and chose a goal to focus on for today. Patient discussed her frustrations of not understanding or being informed about the discharge process. CSW educated patient on discharge process and provided emotional support. Patient began to calm down and thanked CSW for information.  Samuella BruinKristin Emmajo Bennette, MSW, [employer]CSWA Clinical Social Worker [facility] [phone]

## 2014-04-25 ENCOUNTER — Ambulatory Visit (INDEPENDENT_AMBULATORY_CARE_PROVIDER_SITE_OTHER): Payer: BC Managed Care – PPO | Admitting: Licensed Clinical Social Worker

## 2014-04-25 DIAGNOSIS — F314 Bipolar disorder, current episode depressed, severe, without psychotic features: Secondary | ICD-10-CM

## 2014-04-26 DIAGNOSIS — F319 Bipolar disorder, unspecified: Secondary | ICD-10-CM | POA: Insufficient documentation

## 2014-04-26 NOTE — Progress Notes (Signed)
Patient Discharge Instructions:  Next Level Care Provider Has Access to the EMR, 04/26/14  [facility] via [facility]/Epic access. Jerelene ReddenSheena E [place], 04/26/2014, 1:31 PM

## 2014-04-26 NOTE — Psych (Signed)
THERAPIST PROGRESS NOTE  Session Time: 16:10RU-04:54UJ  Participation Level: Active  Behavioral Response: DisheveledConfusedAngry, Anxious and Hopeless  Type of Therapy: Individual Therapy  Treatment Goals addressed: Anxiety  Interventions: solution-focused  Summary: Met with patient for an unscheduled session.  She came to the office today thinking she had a therapy appointment.  She was discharged from a psychiatric hospitalization yesterday.  Patient appeared confused and highly anxious.   Patient denied SI saying "I've got too much to live for.  I want to be alive to know my grandchildren."  She went on to lament about how she did not want to be a source of stress for her daughter who she knows is trying to get pregnant.  Patient indicated she was distraught to learn she would not be seen for medication management tomorrow as she thought and will have to wait until her appointment scheduled for next week.  She reported that she had discussed increasing the dosage of her Lamictal with one of the psychiatrists at the hospital and she wanted to see about making that change as soon as possible.  Therapist explained that some psychiatric medications take time to get in your system before you experience a significant reduction in symptoms.       Patient's mood was labile throughout the session.  At times she was appreciative of the therapist's willingness to meet with her and discuss issues of concern.  At other times she came across as defensive, saying "I'm sorry that I'm wasting your time."  Defensiveness was most apparent when she talked about concerns related to not being able to get the medication she feels she needs.          ?  Suicidal/Homicidal: Nowithout intent/plan  Denies SI at this time.  Therapist Response:  Staff contacted patient's emergency contact to express concern about her present state of mind.  Requested that emergency contact come to the office as a safety  precaution.  Meanwhile patient met with this therapist.  Therapist gathered information about circumstances leading to patient's psychiatric hospitalization and her course of treatment.  Discussed patient's concerns regarding the fact that she felt her daughter and son-in-law were being overprotective of her, not trusting her to take her medications as prescribed.  Assessed for suicidal ideation.   Patient's son-in-law arrived and was asked to join the session with permission from the patient.  Therapist asked for his input on patient's condition.  Discussed patient's preferences for support.  Emphasized the need for patient to have daily contact with her daughter and/or son-in-law at this time and discouraged isolation.  Suggested that patient agree to allow her family members to hold onto her medications to ensure that she is taking them as prescribed.  Patient agreed that she did not need to be left alone at this time.  She expressed intentions of taking her medications as prescribed. Before ending the session the therapist asked patient about her plans for the rest of the day.  Patient said she was going to walk her dogs and do some housework.  She also expressed intentions of getting her sleep medication filled and taking it at bedtime.  Patient indicated she was looking forward to spending time with her family during the Thanksgiving holiday.   Plan: Return again in one week.  A treatment plan will be developed at that time.  May consider referral to [facility] program.  Diagnosis: F31.81 Bipolar II disorder, depressive episode with mixed features and anxious distress  Garnette Scheuermann, LCSW 04/26/2014

## 2014-05-02 ENCOUNTER — Emergency Department (HOSPITAL_COMMUNITY)
Admission: EM | Admit: 2014-05-02 | Discharge: 2014-05-03 | Disposition: A | Discharge status: Home / Self Care | Payer: BC Managed Care – PPO | Attending: Emergency Medicine | Admitting: Emergency Medicine

## 2014-05-02 ENCOUNTER — Encounter (HOSPITAL_COMMUNITY): Payer: Self-pay

## 2014-05-02 DIAGNOSIS — F419 Anxiety disorder, unspecified: Secondary | ICD-10-CM | POA: Insufficient documentation

## 2014-05-02 DIAGNOSIS — Z880 Allergy status to penicillin: Secondary | ICD-10-CM | POA: Diagnosis not present

## 2014-05-02 DIAGNOSIS — K259 Gastric ulcer, unspecified as acute or chronic, without hemorrhage or perforation: Secondary | ICD-10-CM | POA: Insufficient documentation

## 2014-05-02 DIAGNOSIS — F314 Bipolar disorder, current episode depressed, severe, without psychotic features: Secondary | ICD-10-CM

## 2014-05-02 DIAGNOSIS — Z79899 Other long term (current) drug therapy: Secondary | ICD-10-CM | POA: Insufficient documentation

## 2014-05-02 DIAGNOSIS — F32A Depression, unspecified: Secondary | ICD-10-CM

## 2014-05-02 DIAGNOSIS — F1310 Sedative, hypnotic or anxiolytic abuse, uncomplicated: Secondary | ICD-10-CM | POA: Insufficient documentation

## 2014-05-02 DIAGNOSIS — I10 Essential (primary) hypertension: Secondary | ICD-10-CM | POA: Diagnosis not present

## 2014-05-02 DIAGNOSIS — F319 Bipolar disorder, unspecified: Secondary | ICD-10-CM | POA: Diagnosis present

## 2014-05-02 DIAGNOSIS — F329 Major depressive disorder, single episode, unspecified: Secondary | ICD-10-CM

## 2014-05-02 DIAGNOSIS — K219 Gastro-esophageal reflux disease without esophagitis: Secondary | ICD-10-CM

## 2014-05-02 LAB — ETHANOL

## 2014-05-02 LAB — CBC
HCT: 35.7 % — ABNORMAL LOW (ref 36.0–46.0)
Hemoglobin: 11.3 g/dL — ABNORMAL LOW (ref 12.0–15.0)
MCH: 31.3 pg (ref 26.0–34.0)
MCHC: 31.7 g/dL (ref 30.0–36.0)
MCV: 98.9 fL (ref 78.0–100.0)
PLATELETS: 400 10*3/uL (ref 150–400)
RBC: 3.61 MIL/uL — ABNORMAL LOW (ref 3.87–5.11)
RDW: 14.2 % (ref 11.5–15.5)
WBC: 8.2 10*3/uL (ref 4.0–10.5)

## 2014-05-02 LAB — URINALYSIS, ROUTINE W REFLEX MICROSCOPIC
Bilirubin Urine: NEGATIVE
Glucose, UA: NEGATIVE mg/dL
Ketones, ur: NEGATIVE mg/dL
Leukocytes, UA: NEGATIVE
Nitrite: NEGATIVE
PH: 6.5 (ref 5.0–8.0)
Protein, ur: NEGATIVE mg/dL
SPECIFIC GRAVITY, URINE: 1.003 — AB (ref 1.005–1.030)
Urobilinogen, UA: 0.2 mg/dL (ref 0.0–1.0)

## 2014-05-02 LAB — COMPREHENSIVE METABOLIC PANEL
ALT: 16 U/L (ref 0–35)
AST: 19 U/L (ref 0–37)
Albumin: 3.4 g/dL — ABNORMAL LOW (ref 3.5–5.2)
Alkaline Phosphatase: 102 U/L (ref 39–117)
Anion gap: 14 (ref 5–15)
BUN: 21 mg/dL (ref 6–23)
CALCIUM: 9.1 mg/dL (ref 8.4–10.5)
CHLORIDE: 105 meq/L (ref 96–112)
CO2: 23 mEq/L (ref 19–32)
Creatinine, Ser: 1 mg/dL (ref 0.50–1.10)
GFR calc Af Amer: 70 mL/min — ABNORMAL LOW (ref >90)
GFR calc non Af Amer: 60 mL/min — ABNORMAL LOW (ref >90)
Glucose, Bld: 117 mg/dL — ABNORMAL HIGH (ref 70–99)
Potassium: 3.8 mEq/L (ref 3.7–5.3)
Sodium: 142 mEq/L (ref 137–147)
TOTAL PROTEIN: 7.3 g/dL (ref 6.0–8.3)

## 2014-05-02 LAB — RAPID URINE DRUG SCREEN, HOSP PERFORMED
Amphetamines: NOT DETECTED
Barbiturates: NOT DETECTED
Benzodiazepines: POSITIVE — AB
COCAINE: NOT DETECTED
OPIATES: NOT DETECTED
Tetrahydrocannabinol: NOT DETECTED

## 2014-05-02 LAB — SALICYLATE LEVEL

## 2014-05-02 LAB — URINE MICROSCOPIC-ADD ON

## 2014-05-02 LAB — ACETAMINOPHEN LEVEL

## 2014-05-02 MED ORDER — METHOCARBAMOL 500 MG PO TABS
500.0000 mg | ORAL_TABLET | Freq: Three times a day (TID) | ORAL | Status: DC | PRN
Start: 2014-05-02 — End: 2014-05-03

## 2014-05-02 MED ORDER — ONDANSETRON HCL 4 MG PO TABS: 4.0000 mg | ORAL_TABLET | Freq: Three times a day (TID) | ORAL | Status: DC | PRN

## 2014-05-02 MED ORDER — HYDROCHLOROTHIAZIDE 12.5 MG PO CAPS
12.5000 mg | ORAL_CAPSULE | Freq: Every day | ORAL | Status: DC
  Administered 2014-05-03: 12.5 mg via ORAL
  Filled 2014-05-02: qty 1

## 2014-05-02 MED ORDER — ALUM & MAG HYDROXIDE-SIMETH 200-200-20 MG/5ML PO SUSP: 30.0000 mL | Freq: Four times a day (QID) | ORAL | Status: DC | PRN

## 2014-05-02 MED ORDER — PANTOPRAZOLE SODIUM 40 MG PO TBEC
40.0000 mg | DELAYED_RELEASE_TABLET | Freq: Every day | ORAL | Status: DC
Start: 2014-05-02 — End: 2014-05-03
  Administered 2014-05-02: 40 mg via ORAL
  Filled 2014-05-02 (×2): qty 1

## 2014-05-02 MED ORDER — ACETAMINOPHEN 325 MG PO TABS: 650.0000 mg | ORAL_TABLET | Freq: Four times a day (QID) | ORAL | Status: DC | PRN

## 2014-05-02 MED ORDER — LAMOTRIGINE 25 MG PO TABS
25.0000 mg | ORAL_TABLET | Freq: Every day | ORAL | Status: DC
  Administered 2014-05-03: 25 mg via ORAL
  Filled 2014-05-02: qty 1

## 2014-05-02 MED ORDER — AMLODIPINE-VALSARTAN-HCTZ 10-160-12.5 MG PO TABS
1.0000 | ORAL_TABLET | Freq: Every day | ORAL | Status: DC
Start: 2014-05-02 — End: 2014-05-02

## 2014-05-02 MED ORDER — ACETAMINOPHEN 325 MG PO TABS: 650.0000 mg | ORAL_TABLET | ORAL | Status: DC | PRN

## 2014-05-02 MED ORDER — AMLODIPINE BESYLATE 10 MG PO TABS
10.0000 mg | ORAL_TABLET | Freq: Every day | ORAL | Status: DC
  Administered 2014-05-03: 10 mg via ORAL
  Filled 2014-05-02: qty 1

## 2014-05-02 MED ORDER — IRBESARTAN 150 MG PO TABS
150.0000 mg | ORAL_TABLET | Freq: Every day | ORAL | Status: DC
  Administered 2014-05-03: 150 mg via ORAL
  Filled 2014-05-02: qty 1

## 2014-05-02 MED ORDER — NICOTINE 21 MG/24HR TD PT24: 21.0000 mg | MEDICATED_PATCH | Freq: Every day | TRANSDERMAL | Status: DC

## 2014-05-02 MED ORDER — HYDROXYZINE HCL 25 MG PO TABS
25.0000 mg | ORAL_TABLET | Freq: Four times a day (QID) | ORAL | Status: DC | PRN
  Administered 2014-05-02 – 2014-05-03 (×3): 25 mg via ORAL
  Filled 2014-05-02 (×3): qty 1

## 2014-05-02 MED ORDER — ALUM & MAG HYDROXIDE-SIMETH 200-200-20 MG/5ML PO SUSP: 30.0000 mL | ORAL | Status: DC | PRN

## 2014-05-02 MED ORDER — IBUPROFEN 200 MG PO TABS: 600.0000 mg | ORAL_TABLET | Freq: Three times a day (TID) | ORAL | Status: DC | PRN

## 2014-05-02 MED ORDER — TEMAZEPAM 7.5 MG PO CAPS
7.5000 mg | ORAL_CAPSULE | Freq: Every evening | ORAL | Status: DC | PRN
  Administered 2014-05-03: 7.5 mg via ORAL
  Filled 2014-05-02: qty 1

## 2014-05-02 NOTE — BH Assessment (Signed)
Assessment Note  Sarah Mclaughlin is an 58 y.o. female. Pt with major depressive disorder. Brought in by family saying that she is altered this morning and questioning if she has taken too many meds.Patient sts that she was given Propanolol 2 days ago by a physician in Urgent Care. Sts that she took the medication today, however; throat started to "swell up". Patient says that her daughter and son-in-law began to over react this morning. Patient denies today suicidal ideations, homicidal ideations, and AVH's. Patient admits that she had suicidal thoughts several days ago. Sts that she felt suicidal due to job stress. Also, sts that her family left her over the [place] and this made her very sad. She is cooperative but anxious during today's assessment. She displays flight of ideations. Patient oriented to time, person, place, and situation. Patient denies current alcohol and drug use. She admits to a hx of alcohol and drug use.     Axis I: Mood Disorder NOS Axis II: Deferred Axis III:  Past Medical History  Diagnosis Date  . Hypertension   . Anxiety and depression 08/31/2012  . Gastric ulcer    Axis IV: other psychosocial or environmental problems, problems related to social environment, problems with access to health care services and problems with primary support group Axis V: 41-50 serious symptoms  Past Medical History:  Past Medical History  Diagnosis Date  . Hypertension   . Anxiety and depression 08/31/2012  . Gastric ulcer     Past Surgical History  Procedure Laterality Date  . Uterus ruptured  1998    Family History:  Family History  Problem Relation Age of Onset  . Heart attack Mother     father  . Diabetes Mother   . Hypertension Mother   . Stroke      Social History:  reports that she has never smoked. She does not have any smokeless tobacco history on file. She reports that she drinks alcohol. She reports that she does not use illicit drugs.  Additional Social  History:  Substance #1 Name of Substance 1: Patient reports a hx of alcohol abuse 1 - Age of First Use: n/a 1 - Amount (size/oz): n/a 1 - Frequency: n/a 1 - Duration: n/a 1 - Last Use / Amount: n/a Substance #2 Name of Substance 2: Patient reports a hx of drug use (1970's) 2 - Age of First Use: n/a 2 - Amount (size/oz): n/a 2 - Frequency: n/a 2 - Duration: n/a 2 - Last Use / Amount: 1970s  CIWA: CIWA-Ar BP: 145/92 mmHg Pulse Rate: 97 COWS:    Allergies:  Allergies  Allergen Reactions  . Lisinopril Cough  . Penicillins Itching  . Trazodone And Nefazodone Itching    Frequent urination  . Cyclobenzaprine Rash    Mouth ulcers    Home Medications:  (Not in a hospital admission)  OB/GYN Status:  No LMP recorded. Patient is postmenopausal.  General Assessment Data Location of Assessment: [facility] Is this a Tele or Face-to-Face Assessment?: Face-to-Face Is this an Initial Assessment or a Re-assessment for this encounter?: Initial Assessment Living Arrangements: Alone Can pt return to current living arrangement?: Yes Admission Status: Voluntary Is patient capable of signing voluntary admission?: Yes Transfer from: Acute Hospital Referral Source: Self/Family/Friend     [facility] Crisis Care Plan Living Arrangements: Alone Name of Psychiatrist:  (Patient sts that she has a upcoming appt (unable to recall)) Name of Therapist:  (Patient has a upcoming therapy appt. (unable to recall name))  Education  Status Is patient currently in school?: No  Risk to self with the past 6 months Suicidal Ideation: No Suicidal Intent: No Is patient at risk for suicide?: No Suicidal Plan?: No Specify Current Suicidal Plan:  (n/a) Access to Means: No Specify Access to Suicidal Means:  (n/a) What has been your use of drugs/alcohol within the last 12 months?:  (patient reports a hx of substance abuse) Previous Attempts/Gestures: Yes How many times?:  (pt reports 1 prior attempt -overdose  ) Other Self Harm Risks:  (none reported ) Triggers for Past Attempts: Spouse contact Intentional Self Injurious Behavior: None Family Suicide History: Unknown Recent stressful life event(s): Other (Comment) (job stress and conflict with family  ) Persecutory voices/beliefs?: No Depression: Yes Depression Symptoms: Feeling worthless/self pity, Loss of interest in usual pleasures Substance abuse history and/or treatment for substance abuse?: No Suicide prevention information given to non-admitted patients: Not applicable  Risk to Others within the past 6 months Homicidal Ideation: No Thoughts of Harm to Others: No Current Homicidal Intent: No Current Homicidal Plan: No Access to Homicidal Means: No Identified Victim:  (n/a) History of harm to others?: No Assessment of Violence: None Noted Violent Behavior Description:  (patient is calm and cooperative ) Does patient have access to weapons?: No Criminal Charges Pending?: No Does patient have a court date: No  Psychosis Hallucinations: None noted Delusions: None noted  Mental Status Report Appear/Hygiene: Improved Eye Contact: Fair Motor Activity: Freedom of movement Speech: Logical/coherent Level of Consciousness: Quiet/awake Mood: Depressed Affect: Appropriate to circumstance Anxiety Level: Moderate Thought Processes: Coherent, Relevant Judgement: Unimpaired Orientation: Person, Place, Time, Situation Obsessive Compulsive Thoughts/Behaviors: None  Cognitive Functioning Concentration: Decreased Memory: Recent Intact, Remote Intact IQ: Average Insight: Fair Impulse Control: Fair Appetite: Fair Weight Loss:  (none reported ) Weight Gain:  (none reported ) Sleep: Decreased Total Hours of Sleep:  (varies ) Vegetative Symptoms: None  ADLScreening [facility]) Patient's cognitive ability adequate to safely complete daily activities?: Yes Patient able to express need for assistance with ADLs?:  No Independently performs ADLs?: Yes (appropriate for developmental age)  Prior Inpatient Therapy Prior Inpatient Therapy: Yes Prior Therapy Dates: denies Prior Therapy Facilty/Provider(s):  (none reported ) Reason for Treatment: Depression and SI per pt  Prior Outpatient Therapy Prior Outpatient Therapy: Yes Prior Therapy Dates:  (pt has a upcoming appt. (unable to recall name); AA ) Prior Therapy Facilty/Provider(s):  (n/a) Reason for Treatment:  (medication managment, depression, anxiety)  ADL Screening (condition at time of admission) Patient's cognitive ability adequate to safely complete daily activities?: Yes Is the patient deaf or have difficulty hearing?: No Does the patient have difficulty seeing, even when wearing glasses/contacts?: No Does the patient have difficulty concentrating, remembering, or making decisions?: Yes Patient able to express need for assistance with ADLs?: No Does the patient have difficulty dressing or bathing?: No Independently performs ADLs?: Yes (appropriate for developmental age) Does the patient have difficulty walking or climbing stairs?: No Weakness of Legs: None Weakness of Arms/Hands: None  Home Assistive Devices/Equipment Home Assistive Devices/Equipment: None    Abuse/Neglect Assessment (Assessment to be complete while patient is alone) Physical Abuse: Denies Verbal Abuse: Denies Sexual Abuse: Denies Exploitation of patient/patient's resources: Denies Self-Neglect: Denies Values / Beliefs Cultural Requests During Hospitalization: None Spiritual Requests During Hospitalization: None   Advance Directives (For Healthcare) Does patient have an advance directive?: No Would patient like information on creating an advanced directive?: No - patient declined information    Additional Information 1:1 In Past  12 Months?: No CIRT Risk: No Elopement Risk: No Does patient have medical clearance?: Yes     Disposition:   Disposition Initial Assessment Completed for this Encounter: Yes Disposition of Patient: Other dispositions (observe in the ED overnight per Dr. Adela Glimpseabos; re-evaluate in am)  On Site Evaluation by:   Reviewed with Physician:    Melynda RipplePerry, Jona Erkkila [facility] 05/02/2014 5:50 PM

## 2014-05-02 NOTE — ED Notes (Signed)
Patient has been wanded by security. Patient belongings have been taken to the house by son in law.

## 2014-05-02 NOTE — ED Notes (Addendum)
Pt with major depressive disorder.  Brought in by family saying that she is altered this morning and questioning if she has taken too many meds.  Pt states family is overprotective.  Pt denies SI/HI but states she was suicidal last week.  Pt also states increased urination.

## 2014-05-02 NOTE — ED Notes (Signed)
Patient was dressed out but forgot urine sample. Will try again later.

## 2014-05-02 NOTE — ED Provider Notes (Signed)
CSN: [id number]     Arrival date & time 05/02/14  1358 History   First MD Initiated Contact with Patient 05/02/14 1449   This chart is scribed for non-physician practitioner, Marlon Peliffany Zykeria Laguardia, PA-C, working with Nelia Shiobert L Beaton, MD by Abel PrestoKara Demonbreun, ED Scribe.  This patient was seen in [facility] and the patient's care was started 3:15 PM.      Chief Complaint  Patient presents with  . Depression    The history is provided by the patient and a relative. No language interpreter was used.   HPI Comments: Sarah Mclaughlin is a 58 y.o. female with history of depression who presents to the Emergency Department bib son in law because he is concerned she has either taken medication she isn't supposed to or overdosed on her home medications. He says she is normally "unusual" and little bit out of it but she currently is talking about things that don't make sense and is more drowsy than normal.   Per pt, she has not taken any medications that she has not been prescribed, taken too much medication, or any illicit drugs or alcohol. She says her son is over protective. She is mildly drowsy and difficult to follow- flight of ideas. She says she was seen towards the first of the 7th month and that she was suicidal then but that she isn't today. She also complains of home health people going through her things. She reports being drowsy because of the new medication given to her by the psychiatrist and that it makes her very sleepy. Instead of half dose, she reports taking a full dose. VSS  Past Medical History  Diagnosis Date  . Hypertension   . Anxiety and depression 08/31/2012  . Gastric ulcer    Past Surgical History  Procedure Laterality Date  . Uterus ruptured  1998   Family History  Problem Relation Age of Onset  . Heart attack Mother     father  . Diabetes Mother   . Hypertension Mother   . Stroke     History  Substance Use Topics  . Smoking status: Never Smoker   . Smokeless  tobacco: Not on file  . Alcohol Use: Yes     Comment: occasional   OB History    No data available     Review of Systems  Level V caveat- pt is altered  Allergies  Lisinopril; Penicillins; Trazodone and nefazodone; and Cyclobenzaprine  Home Medications   Prior to Admission medications   Medication Sig Start Date End Date Taking? Authorizing Provider  acetaminophen (TYLENOL) 500 MG tablet Take 1 tablet (500 mg total) by mouth every 6 (six) hours as needed for mild pain or headache. 04/24/14   Lindwood QuaSheila May Agustin, NP  Amlodipine-Valsartan-HCTZ 10-160-12.5 MG TABS Take 1 tablet by mouth daily. 04/24/14   Velna HatchetSheila May Agustin, NP  esomeprazole (NEXIUM) 40 MG capsule Take 1 capsule (40 mg total) by mouth daily. 04/24/14   Lindwood QuaSheila May Agustin, NP  lamoTRIgine (LAMICTAL) 25 MG tablet Take 1 tablet (25 mg total) by mouth daily. 04/24/14   Velna HatchetSheila May Agustin, NP  methocarbamol (ROBAXIN) 500 MG tablet Take 1 tablet (500 mg total) by mouth every 8 (eight) hours as needed for muscle spasms. 04/24/14   Velna HatchetSheila May Agustin, NP  temazepam (RESTORIL) 7.5 MG capsule Take 1 capsule (7.5 mg total) by mouth at bedtime as needed for sleep. 04/24/14   Velna HatchetSheila May Agustin, NP   BP 144/127 mmHg  Pulse 114  Temp(Src) 98.2  F (36.8 C) (Oral)  Resp 20  Ht 5' (1.524 m)  Wt 145 lb (65.772 kg)  BMI 28.32 kg/m2  SpO2 95% Physical Exam  Constitutional: She appears well-developed and well-nourished. No distress.  HENT:  Head: Normocephalic and atraumatic. Head is without raccoon's eyes, without Battle's sign, without abrasion, without contusion, without laceration, without right periorbital erythema and without left periorbital erythema.  Right Ear: No hemotympanum.  Nose: Nose normal.  Eyes: Conjunctivae and EOM are normal. Pupils are equal, round, and reactive to light.  Neck: Normal range of motion. Neck supple. No spinous process tenderness and no muscular tenderness present.  Cardiovascular: Normal rate and  regular rhythm.   Pulmonary/Chest: Effort normal. She has no decreased breath sounds. She exhibits no tenderness, no bony tenderness, no crepitus and no retraction.  No seat belt sign or chest tenderness  Abdominal: Soft. Bowel sounds are normal. There is no tenderness. There is no guarding.  No seat belt sign or abdominal wall tenderness  Neurological: She is alert.  Skin: Skin is warm and dry.  Psychiatric: Her mood appears anxious. Her speech is delayed and slurred. She is slowed. She exhibits a depressed mood. She expresses no homicidal and no suicidal ideation. She expresses no suicidal plans and no homicidal plans.  Nursing note and vitals reviewed.     ED Course  Procedures (including critical care time) DIAGNOSTIC STUDIES: Oxygen Saturation is 95% on room air, normal by my interpretation.    COORDINATION OF CARE: 3:27 PM Discussed treatment plan with patient at beside, the patient agrees with the plan and has no further questions at this time.   Labs Review Labs Reviewed  CBC - Abnormal; Notable for the following:    RBC 3.61 (*)    Hemoglobin 11.3 (*)    HCT 35.7 (*)    All other components within normal limits  COMPREHENSIVE METABOLIC PANEL  ETHANOL  ACETAMINOPHEN LEVEL  SALICYLATE LEVEL  URINE RAPID DRUG SCREEN (HOSP PERFORMED)  URINALYSIS, ROUTINE W REFLEX MICROSCOPIC    Imaging Review No results found.   EKG Interpretation None      MDM   Final diagnoses:  Confusion  Depression   Labs are pending Pt needs to be evaluated by TTS and monitored until no longer drowsy. EKG ordered.  Filed Vitals:   05/02/14 1426  BP: 144/127  Pulse: 114  Temp: 98.2 F (36.8 C)  Resp: 20    I personally performed the services described in this documentation, which was scribed in my presence. The recorded information has been reviewed and is accurate.      Dorthula Matasiffany G Tramaine Sauls, PA-C 05/02/14 1553  Nelia Shiobert L Beaton, MD 05/02/14 [phone]

## 2014-05-02 NOTE — ED Notes (Signed)
Patient alert and oriented to person and place. States that she is "feeling better". Rates anxiety and feelings of depression 0/10. States she is "unsure" of what was happening earlier, but her confusion is clearing up at present. Denies SI, HI, AVH at present.   Encouragement offered.  Q 15 safety checks continue.

## 2014-05-02 NOTE — ED Notes (Signed)
Family - son Arlys JohnBrian [phone],  Ashley [phone]

## 2014-05-03 ENCOUNTER — Ambulatory Visit (HOSPITAL_COMMUNITY): Payer: Self-pay | Admitting: Licensed Clinical Social Worker

## 2014-05-03 DIAGNOSIS — F3130 Bipolar disorder, current episode depressed, mild or moderate severity, unspecified: Secondary | ICD-10-CM

## 2014-05-03 MED ORDER — ESOMEPRAZOLE MAGNESIUM 40 MG PO CPDR: 40.0000 mg | DELAYED_RELEASE_CAPSULE | Freq: Every day | ORAL | Status: AC

## 2014-05-03 MED ORDER — LAMOTRIGINE 25 MG PO TABS: 25.0000 mg | ORAL_TABLET | Freq: Every day | ORAL | Status: DC

## 2014-05-03 MED ORDER — HYDROXYZINE HCL 25 MG PO TABS: 25.0000 mg | ORAL_TABLET | Freq: Four times a day (QID) | ORAL | Status: AC | PRN

## 2014-05-03 MED ORDER — AMLODIPINE-VALSARTAN-HCTZ 10-160-12.5 MG PO TABS: 1.0000 | ORAL_TABLET | Freq: Every day | ORAL | Status: AC
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
## 2014-05-03 NOTE — Consult Note (Signed)
[facility] Face-to-Face Psychiatry Consult   Reason for Consult:  SI with plan to overdose on Valium  Referring Physician:  EDP   Sarah Mclaughlin is an 58 y.o. female. Total Time spent with patient: 45 minutes  Assessment: AXIS I:  Bipolar, Depressed AXIS II:  Deferred AXIS III:   Past Medical History  Diagnosis Date  . Hypertension   . Anxiety and depression 08/31/2012  . Gastric ulcer    AXIS IV:  other psychosocial or environmental problems, problems related to social environment and problems with primary support group AXIS V:  61-70 mild symptoms  Plan:  No evidence of imminent risk to self or others at present.   Discharge home to maintain follow up appointments with outpatient therapist and provider in [place]   Subjective:   Sarah Mclaughlin is a 58 y.o. female who was brought to the emergency department by family who reported  That Sarah Mclaughlin was taking too many medications and family was concerned regarding her health and welfare.  HPI:  Patient endorses anhedonia, sadness, and feelings of hopelessness that have increased in severity over the past few weeks due to holidays.  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  Patient reports that she has been feeling increasingly stressed regarding this and began to feel hopeless "in getting my life back together".  Patient denies suicidal or homicidal ideation.  Denies auditory or visual hallucinations.  Does not attend to internal stimuli.  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 Patient has outpatient therapy as well as a psychiatrist with appointments scheduled tomorrow.  States that she would like to maintain follow up with her regular provider as previously scheduled  HPI Elements:   Location:  generalized. Quality:  acute. Severity:  mild. Timing:  constant. Duration:   exacerbating over past few weeks. Context:  loss of job, holidays, relationship stressors. .  Past Psychiatric History: Past Medical History  Diagnosis Date  . Hypertension   . Anxiety and depression 08/31/2012  . Gastric ulcer     reports that she has never smoked. She does not have any smokeless tobacco history on file. She reports that she drinks alcohol. She reports that she does not use illicit drugs. Family History  Problem Relation Age of Onset  . Heart attack Mother     father  . Diabetes Mother   . Hypertension Mother   . Stroke     Family History Substance Abuse: No Family Supports: Yes, List: Living Arrangements: Alone Can pt return to current living arrangement?: Yes Abuse/Neglect [facility]) Physical Abuse: Denies Verbal Abuse: Denies Sexual Abuse: Denies Allergies:   Allergies  Allergen Reactions  . Lisinopril Cough  . Penicillins Itching  . Trazodone And Nefazodone Itching    Frequent urination  . Cyclobenzaprine Rash    Mouth ulcers    ACT Assessment Complete:  Yes:    Educational Status    Risk to Self: Risk to self with the past 6 months Suicidal Ideation: No Suicidal Intent: No Is patient at risk for suicide?: No Suicidal Plan?: No Specify Current Suicidal Plan:  (n/a) Access to Means: No Specify Access to Suicidal Means:  (n/a) What has been your use of drugs/alcohol within the last 12 months?:  (patient reports a hx of substance abuse) Previous Attempts/Gestures: Yes How many times?:  (pt reports 1  prior attempt -overdose ) Other Self Harm Risks:  (none reported ) Triggers for Past Attempts: Spouse contact Intentional Self Injurious Behavior: None Family Suicide History: Unknown Recent stressful life event(s): Other (Comment) (job stress and conflict with family  ) Persecutory voices/beliefs?: No Depression: Yes Depression Symptoms: Feeling worthless/self pity, Loss of interest in usual pleasures Substance abuse history and/or treatment for  substance abuse?: No Suicide prevention information given to non-admitted patients: Not applicable  Risk to Others: Risk to Others within the past 6 months Homicidal Ideation: No Thoughts of Harm to Others: No Current Homicidal Intent: No Current Homicidal Plan: No Access to Homicidal Means: No Identified Victim:  (n/a) History of harm to others?: No Assessment of Violence: None Noted Violent Behavior Description:  (patient is calm and cooperative ) Does patient have access to weapons?: No Criminal Charges Pending?: No Does patient have a court date: No  Abuse: Abuse/Neglect Assessment (Assessment to be complete while patient is alone) Physical Abuse: Denies Verbal Abuse: Denies Sexual Abuse: Denies Exploitation of patient/patient's resources: Denies Self-Neglect: Denies  Prior Inpatient Therapy: Prior Inpatient Therapy Prior Inpatient Therapy: Yes Prior Therapy Dates: denies Prior Therapy Facilty/Provider(s):  (none reported ) Reason for Treatment: Depression and SI per pt  Prior Outpatient Therapy: Prior Outpatient Therapy Prior Outpatient Therapy: Yes Prior Therapy Dates:  (pt has a upcoming appt. (unable to recall name); AA ) Prior Therapy Facilty/Provider(s):  (n/a) Reason for Treatment:  (medication managment, depression, anxiety)  Additional Information: Additional Information 1:1 In Past 12 Months?: No CIRT Risk: No Elopement Risk: No Does patient have medical clearance?: Yes                  Objective: Blood pressure 118/84, pulse 86, temperature 97.4 F (36.3 C), temperature source Oral, resp. rate 18, height 5' (1.524 m), weight 65.772 kg (145 lb), SpO2 96 %.Body mass index is 28.32 kg/(m^2). Results for orders placed or performed during the hospital encounter of 05/02/14 (from the past 72 hour(s))  CBC     Status: Abnormal   Collection Time: 05/02/14  3:00 PM  Result Value Ref Range   WBC 8.2 4.0 - 10.5 K/uL   RBC 3.61 (L) 3.87 - 5.11 MIL/uL    Hemoglobin 11.3 (L) 12.0 - 15.0 g/dL   HCT 35.7 (L) 36.0 - 46.0 %   MCV 98.9 78.0 - 100.0 fL   MCH 31.3 26.0 - 34.0 pg   MCHC 31.7 30.0 - 36.0 g/dL   RDW 14.2 11.5 - 15.5 %   Platelets 400 150 - 400 K/uL  Comprehensive metabolic panel     Status: Abnormal   Collection Time: 05/02/14  3:00 PM  Result Value Ref Range   Sodium 142 137 - 147 mEq/L   Potassium 3.8 3.7 - 5.3 mEq/L   Chloride 105 96 - 112 mEq/L   CO2 23 19 - 32 mEq/L   Glucose, Bld 117 (H) 70 - 99 mg/dL   BUN 21 6 - 23 mg/dL   Creatinine, Ser 1.00 0.50 - 1.10 mg/dL   Calcium 9.1 8.4 - 10.5 mg/dL   Total Protein 7.3 6.0 - 8.3 g/dL   Albumin 3.4 (L) 3.5 - 5.2 g/dL   AST 19 0 - 37 U/L   ALT 16 0 - 35 U/L   Alkaline Phosphatase 102 39 - 117 U/L   Total Bilirubin <0.2 (L) 0.3 - 1.2 mg/dL   GFR calc non Af Amer 60 (L) >90 mL/min   GFR calc Af Amer 70 (L) >  90 mL/min    Comment: (NOTE) The eGFR has been calculated using the CKD EPI equation. This calculation has not been validated in all clinical situations. eGFR's persistently <90 mL/min signify possible Chronic Kidney Disease.    Anion gap 14 5 - 15  Ethanol (ETOH)     Status: None   Collection Time: 05/02/14  3:00 PM  Result Value Ref Range   Alcohol, Ethyl (B) <11 0 - 11 mg/dL    Comment:        LOWEST DETECTABLE LIMIT FOR SERUM ALCOHOL IS 11 mg/dL FOR MEDICAL PURPOSES ONLY   Acetaminophen level     Status: None   Collection Time: 05/02/14  3:00 PM  Result Value Ref Range   Acetaminophen (Tylenol), Serum <15.0 10 - 30 ug/mL    Comment:        THERAPEUTIC CONCENTRATIONS VARY SIGNIFICANTLY. A RANGE OF 10-30 ug/mL MAY BE AN EFFECTIVE CONCENTRATION FOR MANY PATIENTS. HOWEVER, SOME ARE BEST TREATED AT CONCENTRATIONS OUTSIDE THIS RANGE. ACETAMINOPHEN CONCENTRATIONS >150 ug/mL AT 4 HOURS AFTER INGESTION AND >50 ug/mL AT 12 HOURS AFTER INGESTION ARE OFTEN ASSOCIATED WITH TOXIC REACTIONS.   Salicylate level     Status: Abnormal   Collection Time: 05/02/14   3:00 PM  Result Value Ref Range   Salicylate Lvl <9.6 (L) 2.8 - 20.0 mg/dL  Urine rapid drug screen (hosp performed)     Status: Abnormal   Collection Time: 05/02/14  6:13 PM  Result Value Ref Range   Opiates NONE DETECTED NONE DETECTED   Cocaine NONE DETECTED NONE DETECTED   Benzodiazepines POSITIVE (A) NONE DETECTED   Amphetamines NONE DETECTED NONE DETECTED   Tetrahydrocannabinol NONE DETECTED NONE DETECTED   Barbiturates NONE DETECTED NONE DETECTED    Comment:        DRUG SCREEN FOR MEDICAL PURPOSES ONLY.  IF CONFIRMATION IS NEEDED FOR ANY PURPOSE, NOTIFY LAB WITHIN 5 DAYS.        LOWEST DETECTABLE LIMITS FOR URINE DRUG SCREEN Drug Class       Cutoff (ng/mL) Amphetamine      1000 Barbiturate      200 Benzodiazepine   283 Tricyclics       662 Opiates          300 Cocaine          300 THC              50   Urinalysis, Routine w reflex microscopic     Status: Abnormal   Collection Time: 05/02/14  6:13 PM  Result Value Ref Range   Color, Urine YELLOW YELLOW   APPearance CLEAR CLEAR   Specific Gravity, Urine 1.003 (L) 1.005 - 1.030   pH 6.5 5.0 - 8.0   Glucose, UA NEGATIVE NEGATIVE mg/dL   Hgb urine dipstick SMALL (A) NEGATIVE   Bilirubin Urine NEGATIVE NEGATIVE   Ketones, ur NEGATIVE NEGATIVE mg/dL   Protein, ur NEGATIVE NEGATIVE mg/dL   Urobilinogen, UA 0.2 0.0 - 1.0 mg/dL   Nitrite NEGATIVE NEGATIVE   Leukocytes, UA NEGATIVE NEGATIVE  Urine microscopic-add on     Status: None   Collection Time: 05/02/14  6:13 PM  Result Value Ref Range   Squamous Epithelial / LPF RARE RARE   RBC / HPF 7-10 <3 RBC/hpf   Labs are reviewed and are pertinent for medical issues being treated .  Current Facility-Administered Medications  Medication Dose Route Frequency Provider Last Rate Last Dose  . acetaminophen (TYLENOL) tablet 650 mg  650 mg  Oral Q6H PRN Shuvon Rankin, NP      . acetaminophen (TYLENOL) tablet 650 mg  650 mg Oral Q4H PRN Linus Mako, PA-C      . alum & mag  hydroxide-simeth (MAALOX/MYLANTA) 200-200-20 MG/5ML suspension 30 mL  30 mL Oral Q6H PRN Shuvon Rankin, NP      . amLODipine (NORVASC) tablet 10 mg  10 mg Oral Daily Linus Mako, PA-C   10 mg at 05/03/14 3536   And  . irbesartan (AVAPRO) tablet 150 mg  150 mg Oral Daily Linus Mako, PA-C   150 mg at 05/03/14 1443   And  . hydrochlorothiazide (MICROZIDE) capsule 12.5 mg  12.5 mg Oral Daily Linus Mako, PA-C   12.5 mg at 05/03/14 0959  . hydrOXYzine (ATARAX/VISTARIL) tablet 25 mg  25 mg Oral Q6H PRN Shuvon Rankin, NP   25 mg at 05/03/14 1136  . ibuprofen (ADVIL,MOTRIN) tablet 600 mg  600 mg Oral Q8H PRN Linus Mako, PA-C      . lamoTRIgine (LAMICTAL) tablet 25 mg  25 mg Oral Daily Linus Mako, PA-C   25 mg at 05/03/14 0954  . methocarbamol (ROBAXIN) tablet 500 mg  500 mg Oral Q8H PRN Linus Mako, PA-C      . nicotine (NICODERM CQ - dosed in mg/24 hours) patch 21 mg  21 mg Transdermal Daily Linus Mako, PA-C   21 mg at 05/02/14 2055  . ondansetron (ZOFRAN) tablet 4 mg  4 mg Oral Q8H PRN Linus Mako, PA-C      . pantoprazole (PROTONIX) EC tablet 40 mg  40 mg Oral Daily Linus Mako, PA-C   40 mg at 05/02/14 2053  . temazepam (RESTORIL) capsule 7.5 mg  7.5 mg Oral QHS PRN Linus Mako, PA-C   7.5 mg at 05/03/14 0236   Current Outpatient Prescriptions  Medication Sig Dispense Refill  . acetaminophen (TYLENOL) 500 MG tablet Take 1 tablet (500 mg total) by mouth every 6 (six) hours as needed for mild pain or headache. 30 tablet 0  . esomeprazole (NEXIUM) 40 MG capsule Take 1 capsule (40 mg total) by mouth daily. 30 capsule 0  . lamoTRIgine (LAMICTAL) 25 MG tablet Take 1 tablet (25 mg total) by mouth daily. 30 tablet 0  . methocarbamol (ROBAXIN) 500 MG tablet Take 1 tablet (500 mg total) by mouth every 8 (eight) hours as needed for muscle spasms. 30 tablet 0  . Amlodipine-Valsartan-HCTZ 10-160-12.5 MG TABS Take 1 tablet by mouth daily. 30 tablet   .  temazepam (RESTORIL) 7.5 MG capsule Take 1 capsule (7.5 mg total) by mouth at bedtime as needed for sleep. 30 capsule 0    Psychiatric Specialty Exam:     Blood pressure 118/84, pulse 86, temperature 97.4 F (36.3 C), temperature source Oral, resp. rate 18, height 5' (1.524 m), weight 65.772 kg (145 lb), SpO2 96 %.Body mass index is 28.32 kg/(m^2).  General Appearance: Casual  Eye Contact::  Good  Speech:  Pressured and tangential speech   Volume:  Normal  Mood:  Depressed  Affect:  Congruent  Thought Process:  Circumstantial and Tangential  Orientation:  Full (Time, Place, and Person)  Thought Content:  WDL  Suicidal Thoughts:  No  Homicidal Thoughts:  No  Memory:  Immediate;   Fair Recent;   Fair Remote;   Fair  Judgement:  Fair  Insight:  Fair  Psychomotor Activity:  Normal  Concentration:  Fair  Recall:  Fair  Fund of Knowledge:Fair  Language: Good  Akathisia:  No  Handed:  Right  AIMS (if indicated):     Assets:  Communication Skills Desire for Improvement Housing Social Support  Sleep:      Musculoskeletal: Strength & Muscle Tone: within normal limits Gait & Station: normal Patient leans: N/A  Treatment Plan Summary: discharge to home.  Patient is to follow up with Nicki Guadalajara tomorrow for outpatient therapy.  Follow up with Wilburn Mylar for outpatient management of psychotropic medications  Take all your medications as prescribed by your mental healthcare provider.  Report any adverse effects and or reactions from your medicines to your outpatient provider promptly.  Patient is instructed and cautioned to not engage in alcohol and or illegal drug use while on prescription medicines.  In the event of worsening symptoms, patient is instructed to call the crisis hotline, 911 and or go to the nearest ED for appropriate evaluation and treatment of symptoms.  Follow-up with your primary care provider for your other medical issues, concerns and or health care  needs.  Kennedy Bucker  PMH-NP   05/03/2014 1:09 PM  Patient seen, evaluated and I agree with notes by Nurse Practitioner. Corena Pilgrim, MD

## 2014-05-03 NOTE — ED Notes (Signed)
Patient's blood pressure elevated at 1330.  Reading was 211/109.  Patient agreed to take all BP medications that she refused to take this morning.  She came up to desk demanding to leave stating that her son-in-law had to get to a meeting.  Explained to patient that her blood pressure had to come down.  She again demanded to leave.  Spoke with Burnett HarryShelly, NP and it was agreed that patient can leave with son-in-law and follow up with her physician.  Patient's last vital signs were 186/98.  Patient has been extremely anxious and upset with her daughter and son-in-law today which has contributed to her elevated BP.

## 2014-05-03 NOTE — ED Notes (Signed)
Patient tearful, expressing hopelessness. States she is having trouble getting back to sleep, states "I wish I could fall asleep and never wake up again". Reports feeling abandoned and "kicked out" by her child. Upset that she lost her job.  Encouragement offered. Environment adjusted. Restoril and Vistaril given.  Q 15 safety checks continue.

## 2014-05-03 NOTE — ED Notes (Signed)
Patient discharged home.

## 2014-05-03 NOTE — [facility] Suicide Risk Assessment (Signed)
Suicide Risk Assessment  Discharge Assessment     Demographic Factors:  Caucasian, Living alone and Unemployed  Total Time spent with patient: 45 minutes Psychiatric Specialty Exam:     Blood pressure 118/84, pulse 86, temperature 97.4 F (36.3 C), temperature source Oral, resp. rate 18, height 5' (1.524 m), weight 65.772 kg (145 lb), SpO2 96 %.Body mass index is 28.32 kg/(m^2).  General Appearance: Casual  Eye Contact::  Good  Speech:  Pressured and tangential speech   Volume:  Normal  Mood:  Depressed  Affect:  Congruent  Thought Process:  Circumstantial and Tangential  Orientation:  Full (Time, Place, and Person)  Thought Content:  WDL  Suicidal Thoughts:  No  Homicidal Thoughts:  No  Memory:  Immediate;   Fair Recent;   Fair Remote;   Fair  Judgement:  Fair  Insight:  Fair  Psychomotor Activity:  Normal  Concentration:  Fair  Recall:  [employer]Fair  Fund of Knowledge:Fair  Language: Good  Akathisia:  No  Handed:  Right  AIMS (if indicated):     Assets:  Communication Skills Desire for Improvement Housing Social Support  Sleep:      Musculoskeletal: Strength & Muscle Tone: within normal limits Gait & Station: normal Patient leans: N/A    Mental Status Per Nursing Assessment::   On Admission:    Patient presented as depressed, endorsed feelings of loneliness given that family did not visit for holidays   Current Mental Status by Physician: denies suicidal ideation, endorses depression   Loss Factors: Decrease in vocational status  Historical Factors: Prior suicide attempts  Risk Reduction Factors:   Sense of responsibility to family, Positive therapeutic relationship and Positive coping skills or problem solving skills  Continued Clinical Symptoms:  Depression:   Anhedonia  Cognitive Features That Contribute To Risk:  NA   Suicide Risk:  Minimal: No identifiable suicidal ideation.  Patients presenting with no risk factors but with morbid ruminations;  may be classified as minimal risk based on the severity of the depressive symptoms  Discharge Diagnoses:   AXIS I:  Bipolar, Depressed AXIS II:  Deferred AXIS III:   Past Medical History  Diagnosis Date  . Hypertension   . Anxiety and depression 08/31/2012  . Gastric ulcer    AXIS IV:  economic problems, other psychosocial or environmental problems and problems related to social environment AXIS V:  61-70 mild symptoms  Plan Of Care/Follow-up recommendations:  Activity:  as tolerated  Diet:  heart healthy well balanced   Is patient on multiple antipsychotic therapies at discharge:  No   Has Patient had three or more failed trials of antipsychotic monotherapy by history:  No  Recommended Plan for Multiple Antipsychotic Therapies: NA    Sarah Mclaughlin, Sarah Mclaughlin  PMH-NP  05/03/2014, 1:46 PM

## 2014-05-03 NOTE — [facility] Assessment (Signed)
[facility] Assessment Progress Note  Sarah Akintayo, MD asked this writer to contact pt's daughter, Sarah Mclaughlin [phone]), to obtain collateral information.  Pt signed Consent to Release Information to the daughter and at 10:00 I called and spoke to her.  Sarah Mclaughlin reports that the pt lives alone in an apartment [place] which the daughter pays rent.  Sarah Mclaughlin has tried to keep track of pt's medications through on-line resources, but has found that there is a delay of several days between the time that a prescription is filled and when it is posted on-line.  Pt is known to have filled a prescription for Klonopin from an unknown provider on 04/26/2014.  Sarah Mclaughlin also reports that pt [facility], and was discharged with a small supply of unspecified medications for Bipolar Disorder, as well as a muscle relaxer and a benzodiazepine, the name of which starts with a "T."  Yesterday pt was found at home and was "incoherent" and "confused," per Sarah Mclaughlin.  No medications could be found in the home, but pt has a known history of concealing them.  Sarah Mclaughlin denies hearing the pt make any suicidal threats or statements over the past few days, nor have any suicide notes been found.  Sarah Mclaughlin notes that [facility] said that if she had known that it would result in hospitalization, pt would not have told anyone about having SI.  Sarah Mclaughlin denies hearing pt make any homicidal threats or statements, nor has she had any problems with physical aggression toward others.  She has, however, been extremely angry about recently losing her employment as a result of confusion, apparent intoxication, and possession of alcohol in the workplace.  Sarah Mclaughlin denies any direct evidence of pt experiencing or complaining of hallucinations, but she was seen talking to herself yesterday.  Sarah Mclaughlin is unaware of any problems that the pt may have with delusional thought.  Pt has not been neglecting self care in any way  recently.  She [facility] [place] [facility] [place], and has been compliant with these since [facility].  EPIC record shows that she has an appointment with a social worker at the clinic tomorrow (05/04/2014) and with Verne SpurrNeil Mashburn, PA at the same clinic the following day (05/05/2014).  These findings were reported to Dr Jannifer FranklinAkintayo and to Bonnetta BarryShelly Eisbach, NP.  Sarah Canninghomas Margeaux Swantek, MA Triage Specialist 05/03/2014 @ 13:13

## 2014-05-04 ENCOUNTER — Ambulatory Visit (INDEPENDENT_AMBULATORY_CARE_PROVIDER_SITE_OTHER): Payer: BC Managed Care – PPO | Admitting: Licensed Clinical Social Worker

## 2014-05-04 DIAGNOSIS — F3181 Bipolar II disorder: Secondary | ICD-10-CM

## 2014-05-04 NOTE — Psych (Signed)
   THERAPIST PROGRESS NOTE  Session Time: 3:05pm-4:00pm  Participation Level: Active  Behavioral Response: NeatAlertAnxious  Type of Therapy: Individual therapy  Treatment Goals addressed: Coping  Interventions: Solution Focused  Summary: Sarah Mclaughlin presented wearing knee high boots and a polka dot top.  She seemed anxious and agitated, but less so than the last time she met with the therapist.  She reported that she feels like her daughter and son-in-law have withdrawn their support lately.  She said, "It was like they were packing me up and sending me off."   Sarah Mclaughlin was cooperative about developing her treatment plan.  She talked about how she would like to get to the point where she is smiling and laughing more and feeling good about her accomplishments.  She indicated she is open to the interventions the therapist described.   Sarah Mclaughlin mentioned that she is scheduled for medication management tomorrow.  She indicated that she believes she needs changes to her medications before she can effectively focus on working on her goals.      Suicidal/Homicidal: Denied both  Therapist Response:  Collaborated with patient to develop a treatment plan.  Included information about strengths, preferences, and goals.  Provided an explanation of the types of therapeutic interventions she can expect in treatment as well as the basic structure for therapy sessions.  Reiterated the message that recovery from depression is a gradual process.     Plan: Return again in one week.  Will start CBT interventions for depression.  Diagnosis: Bipolar II disorder    Armandina Stammer 05/04/2014

## 2014-05-05 ENCOUNTER — Ambulatory Visit (INDEPENDENT_AMBULATORY_CARE_PROVIDER_SITE_OTHER): Payer: BC Managed Care – PPO | Admitting: Physician Assistant

## 2014-05-05 VITALS — BP 163/103 | HR 80 | Ht 61.0 in | Wt 152.0 lb

## 2014-05-05 DIAGNOSIS — F1320 Sedative, hypnotic or anxiolytic dependence, uncomplicated: Secondary | ICD-10-CM

## 2014-05-05 DIAGNOSIS — F1110 Opioid abuse, uncomplicated: Secondary | ICD-10-CM

## 2014-05-05 DIAGNOSIS — F3181 Bipolar II disorder: Secondary | ICD-10-CM

## 2014-05-09 ENCOUNTER — Telehealth (HOSPITAL_COMMUNITY): Payer: Self-pay | Admitting: *Deleted

## 2014-05-09 ENCOUNTER — Other Ambulatory Visit (HOSPITAL_COMMUNITY): Payer: Self-pay | Admitting: *Deleted

## 2014-05-09 DIAGNOSIS — F331 Major depressive disorder, recurrent, moderate: Secondary | ICD-10-CM

## 2014-05-09 MED ORDER — LAMOTRIGINE 25 MG PO TABS: 25.0000 mg | ORAL_TABLET | Freq: Every day | ORAL | Status: DC

## 2014-05-09 NOTE — Telephone Encounter (Signed)
Per Sarah SpurrNeil Mclaughlin, Pt is authorized for 1 refill for Lamictal 25mg , Qty 30. [facility]. Informed pt she has an appt on 12/23 w/ Lloyd HugerNeil. Pt states and shows understanding.

## 2014-05-09 NOTE — Telephone Encounter (Signed)
Pt call stating she dropped her Lamictal 25mg  down the sink. Please call and advise pt what she will need to do at [phone]. Pt has appt with Maralyn SagoSarah on 12/10.

## 2014-05-11 ENCOUNTER — Ambulatory Visit (INDEPENDENT_AMBULATORY_CARE_PROVIDER_SITE_OTHER): Payer: BC Managed Care – PPO | Admitting: Licensed Clinical Social Worker

## 2014-05-11 DIAGNOSIS — F3160 Bipolar disorder, current episode mixed, unspecified: Secondary | ICD-10-CM
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
## 2014-05-12 NOTE — Progress Notes (Signed)
[facility] [id number] Progress Note This patient was seen on 05/05/2014. Note lost and now re-written due to Epic and Network failure Sarah Harmaneresa Ciolino [id number] y.o.  05/05/2014  Chief Complaint: substance abuse  History of Present Illness: Patient follows up s/p ED visit for multiple medications after [facility] and mood disorder.  Patient has started med seeking behaviors since [facility]. Daughter has taken all of her prescriptive medications from her, and the patient is inconsistent in her story. Today she expresses desire to maintain and achieve sobriety.    Suicidal Ideation: No Plan Formed: No Patient has means to carry out plan: No  Homicidal Ideation: No Plan Formed: No Patient has means to carry out plan: No  Review of Systems: Psychiatric: Agitation: Yes Hallucination: No Depressed Mood: Yes Insomnia: No Hypersomnia: No Altered Concentration: No Feels Worthless: No Grandiose Ideas: No Belief In Special Powers: No New/Increased Substance Abuse: No Compulsions: NA  Neurologic: Headache: NA Seizure: NA Paresthesias: No  Past Medical Family, Social History: I spoke with daughter who stated that patient was taking medications indiscriminantly and was seeking out medication from other providers.  Outpatient Encounter Prescriptions as of 05/05/2014  Medication Sig  . acetaminophen (TYLENOL) 500 MG tablet Take 1 tablet (500 mg total) by mouth every 6 (six) hours as needed for mild pain or headache.  . Amlodipine-Valsartan-HCTZ 10-160-12.5 MG TABS Take 1 tablet by mouth daily.  Marland Kitchen. esomeprazole (NEXIUM) 40 MG capsule Take 1 capsule (40 mg total) by mouth daily.  . hydrOXYzine (ATARAX/VISTARIL) 25 MG tablet Take 1 tablet (25 mg total) by mouth every 6 (six) hours as needed (anxiety).  . methocarbamol (ROBAXIN) 500 MG tablet Take 1 tablet (500 mg total) by mouth every 8 (eight) hours as needed for muscle spasms.  . temazepam  (RESTORIL) 7.5 MG capsule Take 1 capsule (7.5 mg total) by mouth at bedtime as needed for sleep.  . [DISCONTINUED] lamoTRIgine (LAMICTAL) 25 MG tablet Take 1 tablet (25 mg total) by mouth daily.    Past Psychiatric History/Hospitalization(s): Anxiety: Yes Bipolar Disorder: Yes Depression: Yes Mania: No Psychosis: No Schizophrenia: No Personality Disorder: No Hospitalization for psychiatric illness: Yes History of Electroconvulsive Shock Therapy: No Prior Suicide Attempts: No  Physical Exam: Constitutional:  BP 163/103 mmHg  Pulse 80  Ht 5\' 1"  (1.549 m)  Wt 152 lb (68.947 kg)  BMI 28.74 kg/m2  General Appearance: alert, oriented, no acute distress  Musculoskeletal: Strength & Muscle Tone: within normal limits Gait & Station: normal Patient leans: N/A  Psychiatric: Speech (describe rate, volume, coherence, spontaneity, and abnormalities if any): normal  Thought Process (describe rate, content, abstract reasoning, and computation): inconsistent  Associations: Coherent and Relevant  Thoughts: normal  Mental Status: Orientation: oriented to person, place, time/date and situation Mood & Affect: anxiety Attention Span & Concentration: fair  Medical Decision Making (Choose Three): Established Problem, Worsening (2) and New Problem, with no additional work-up planned (3)  Assessment: Axis I: Polysubstance abuse with medication seeking behavior, bipolar disorder MRE manic  Axis II:   Axis III:   Axis IV:   Axis V:    Plan:  1. Had a lengthy discussion with the patient who agreed to: Medication contract, 1 pharmacy, 1 provider for psychiatric medication. 2. Patient will work toward a goal of sobriety by following 12 step program and will obtain a sponsor. 3. Family is encouraged to attend [place] for supportive coping skills. 4. Patient is provided with a copy of  Medication contract. 5. She is made fully aware that she will be discharged if she violates the  contract. 6. Medications are written as documented. 7. Patient to follow up as planned. 8. Schedule with OPT.  Caira Poche, PA-C 05/12/2014

## 2014-05-16 ENCOUNTER — Encounter (HOSPITAL_COMMUNITY): Payer: Self-pay | Admitting: Physician Assistant

## 2014-05-16 ENCOUNTER — Ambulatory Visit (INDEPENDENT_AMBULATORY_CARE_PROVIDER_SITE_OTHER): Payer: BC Managed Care – PPO | Admitting: Physician Assistant

## 2014-05-16 VITALS — BP 94/63 | HR 93 | Ht 61.0 in | Wt 155.0 lb

## 2014-05-16 DIAGNOSIS — F1920 Other psychoactive substance dependence, uncomplicated: Secondary | ICD-10-CM

## 2014-05-16 DIAGNOSIS — F1320 Sedative, hypnotic or anxiolytic dependence, uncomplicated: Secondary | ICD-10-CM

## 2014-05-16 DIAGNOSIS — F411 Generalized anxiety disorder: Secondary | ICD-10-CM

## 2014-05-16 DIAGNOSIS — F331 Major depressive disorder, recurrent, moderate: Secondary | ICD-10-CM

## 2014-05-16 DIAGNOSIS — F308 Other manic episodes: Secondary | ICD-10-CM

## 2014-05-16 DIAGNOSIS — F1110 Opioid abuse, uncomplicated: Secondary | ICD-10-CM

## 2014-05-16 DIAGNOSIS — F3160 Bipolar disorder, current episode mixed, unspecified: Secondary | ICD-10-CM

## 2014-05-16 DIAGNOSIS — F319 Bipolar disorder, unspecified: Secondary | ICD-10-CM

## 2014-05-16 MED ORDER — LAMOTRIGINE 25 MG PO TABS: 25.0000 mg | ORAL_TABLET | Freq: Every day | ORAL | Status: DC

## 2014-05-16 MED ORDER — QUETIAPINE FUMARATE 50 MG PO TABS: 50.0000 mg | ORAL_TABLET | Freq: Every day | ORAL | Status: AC

## 2014-05-16 MED ORDER — QUETIAPINE FUMARATE 50 MG PO TABS: 50.0000 mg | ORAL_TABLET | Freq: Two times a day (BID) | ORAL | Status: DC

## 2014-05-16 NOTE — Patient Instructions (Signed)
1. Attend 15 AA/NA meetings in the next 21 days please. 2. Follow up in 3 weeks. 3. Continue as directed with AA/NA.

## 2014-05-16 NOTE — Progress Notes (Signed)
  [facility] [id number] Progress Note This patient was seen on 05/05/2014. Note lost and now re-written due to Epic and Network failure Velva Harmaneresa Hendrickson  05/16/2014   Chief Complaint: substance abuse  History of Present Illness: Patient notes that she has been worse over her medications, had dropped her Lamictal down the sink, which was refilled. She has since found that she is going to be a grand mother.  Suicidal Ideation: No Plan Formed: No Patient has means to carry out plan: No  Homicidal Ideation: No Plan Formed: No Patient has means to carry out plan: No  Review of Systems: Psychiatric: Agitation: Yes Hallucination: No Depressed Mood: Yes  Insomnia: No Hypersomnia: No Altered Concentration: No Feels Worthless: No Grandiose Ideas: No Belief In Special Powers: No New/Increased Substance Abuse: No Compulsions: NA  Neurologic: Headache: NA Seizure: NA Paresthesias: No  Past Medical Family, Social History: Patient states that she is going to be a grandmother.  Outpatient Encounter Prescriptions as of 05/16/2014  Medication Sig  . acetaminophen (TYLENOL) 500 MG tablet Take 1 tablet (500 mg total) by mouth every 6 (six) hours as needed for mild pain or headache.  . Amlodipine-Valsartan-HCTZ 10-160-12.5 MG TABS Take 1 tablet by mouth daily.  Marland Kitchen. esomeprazole (NEXIUM) 40 MG capsule Take 1 capsule (40 mg total) by mouth daily.  . hydrOXYzine (ATARAX/VISTARIL) 25 MG tablet Take 1 tablet (25 mg total) by mouth every 6 (six) hours as needed (anxiety).  Marland Kitchen. lamoTRIgine (LAMICTAL) 25 MG tablet Take 1 tablet (25 mg total) by mouth daily.  . methocarbamol (ROBAXIN) 500 MG tablet Take 1 tablet (500 mg total) by mouth every 8 (eight) hours as needed for muscle spasms.  . temazepam (RESTORIL) 7.5 MG capsule Take 1 capsule (7.5 mg total) by mouth at bedtime as needed for sleep.    Past Psychiatric History/Hospitalization(s): Anxiety: Yes Bipolar Disorder: Yes Depression: Yes  8/10 Mania: No Psychosis: No Schizophrenia: No Personality Disorder: No Hospitalization for psychiatric illness: Yes History of Electroconvulsive Shock Therapy: No Prior Suicide Attempts: yes  Physical Exam: Constitutional:  BP 94/63 mmHg  Pulse 93  Ht 5\' 1"  (1.549 m)  Wt 155 lb (70.308 kg)  BMI 29.30 kg/m2  General Appearance: alert, oriented, no acute distress  Musculoskeletal: Strength & Muscle Tone: within normal limits Gait & Station: normal Patient leans: N/A  Psychiatric: Speech (describe rate, volume, coherence, spontaneity, and abnormalities if any): pressured and circumstantial  Thought Process (describe rate, content, abstract reasoning, and computation): inconsistent  Associations: Coherent and Relevant  Thoughts: normal  Mental Status: Orientation: oriented to person, place, time/date and situation Mood & Affect: anxiety Attention Span & Concentration: fair  Medical Decision Making (Choose Three): Established Problem, Worsening (2) and New Problem, with no additional work-up planned (3)  Assessment: Axis I: Polysubstance abuse with medication seeking behavior, bipolar disorder MRE. Hypo mania   Plan:  1. Patient will attend 15 AA/NA meetings in the next 21 days. 2. Will initiate low dose Seroquel generic 50mg  at hs for hypomania. 3. Will continue all medications as documented. 4. Will return in 21 days /3 weeks. Kenneith Stief, PA-C 05/16/2014

## 2014-05-18 ENCOUNTER — Ambulatory Visit (INDEPENDENT_AMBULATORY_CARE_PROVIDER_SITE_OTHER): Payer: BC Managed Care – PPO | Admitting: Licensed Clinical Social Worker

## 2014-05-18 DIAGNOSIS — F3160 Bipolar disorder, current episode mixed, unspecified: Secondary | ICD-10-CM

## 2014-05-18 NOTE — Psych (Signed)
   THERAPIST PROGRESS NOTE  Session Time: 2:55pm - 3:50pm  Participation Level: Active  Behavioral Response: Disheveled (bra was showing), Alert, Agitated, speech pressured and tangential  Type of Therapy: Individual therapy  Treatment Goals addressed: increase enjoyment of activities  Interventions: CBT  Suicidal/Homicidal: Denied both  Therapist Interventions: Gathered information about significant events and changes in mood and functioning over the past week.  Suggested practicing visual imagery in combination with listening to relaxing sounds as a way to focus her mind on something besides her overwhelming thoughts.  Recommended a book about bipolar disorder called [place].        Summary: Sarah Mclaughlin's overall presentation was agitated.  She reported that she stayed overnight at the hospital one night this week because her blood pressure had gotten too low.  She reports the doctor advised her to stop taking her blood pressure medication.   Sarah Mclaughlin talked about wanting to run away to a peaceful tropical location.  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  She said, "I'm just not in the mood for anything."  During her last medication management appointment she agreed to attend AA or NA meetings.  She has yet to attend one.  She did not seem particularly receptive to the therapist's suggestions for relaxation.  She did say she would [facility] [employer] to see if they have a copy of the book the therapist recommended.         Plan: Return again in one week.    Diagnosis: Bipolar I disorder, mixed    Darrin LuisSolomon, Kavina Cantave A, LCSW 05/18/2014

## 2014-05-24 ENCOUNTER — Encounter (INDEPENDENT_AMBULATORY_CARE_PROVIDER_SITE_OTHER): Payer: Self-pay

## 2014-05-24 ENCOUNTER — Telehealth (HOSPITAL_COMMUNITY): Payer: Self-pay | Admitting: Physician Assistant

## 2014-05-24 ENCOUNTER — Ambulatory Visit (INDEPENDENT_AMBULATORY_CARE_PROVIDER_SITE_OTHER): Payer: BC Managed Care – PPO | Admitting: Licensed Clinical Social Worker

## 2014-05-24 ENCOUNTER — Ambulatory Visit (INDEPENDENT_AMBULATORY_CARE_PROVIDER_SITE_OTHER): Payer: BC Managed Care – PPO | Admitting: Physician Assistant

## 2014-05-24 DIAGNOSIS — F3160 Bipolar disorder, current episode mixed, unspecified: Secondary | ICD-10-CM

## 2014-05-24 DIAGNOSIS — Z79899 Other long term (current) drug therapy: Secondary | ICD-10-CM

## 2014-05-24 MED ORDER — QUETIAPINE FUMARATE 100 MG PO TABS: 100.0000 mg | ORAL_TABLET | Freq: Every day | ORAL | Status: AC

## 2014-05-24 NOTE — Telephone Encounter (Signed)
I did speak with Dr. Micah Noeladiotenchenko regarding Ms. Eckley and he will speak to Mitzi HansenJanice Daniel FNP regarding her care and they will address this with the patient and let me know what there decision is regarding their continued care for Ms. Wittmeyer. Rona RavensNeil T. Laveta Gilkey RPAC 5:30 PM 05/24/2014

## 2014-05-24 NOTE — Psych (Signed)
   THERAPIST PROGRESS NOTE  Session Time: 1:30pm-2:45pm  Participation Level: Active  Behavioral Response: Casual, Alert, Depressed  Type of Therapy: Individual therapy  Treatment Goals addressed: increase enjoyment of activities  Interventions: psycho-education about bipolar  Suicidal/Homicidal: Denied both  Therapist Interventions: The focus for the session today was on educating patient about bipolar disorder.  Explained what the differences are between a manic, hypomanic, and depressive episode.  Showed patient a Mood Chart that can be used as a tool to keep track of mood on a daily or weekly basis.  Asked patient to choose a value representing what best describes her current mood.   Patient reported concerns about medication.  She also went on to report experiencing hallucinations.  Gathered information about duration and content of these hallucinations.  Reported these concerns to Verne SpurrNeil Mashburn, PA-C, the doctor patient sees at this location for medication management.         Summary: [name] presented differently than she had a week ago.  She did not talk as much.  She reported that she will be spending Christmas by herself as her daughter [place].  On the mood chart she indicated that she feels she is experiencing a moderate level of depression.   [name] reported that every night when she takes her Lamictal she ends up throwing up.  She said this has been going on ever since she started the medication about a month ago.  She also reported that over the past two weeks she has been experiencing auditory and visual hallucinations.  She claims to see a little girl sitting on the furniture in her home.  The little girl talks to her.  [name] also claimed to hear a family sometimes talking and sometimes playing music together.  She reported that this is not the first time she has had these hallucinations.  She said, "I've heard them before in the  past, 6-7 months ago."  Therapist asked if she had ever told anyone about these hallucinations.  She said no.   Both concerns were reported to the doctor.  The doctor advised her to stop taking Lamictal and agreed to increase the dosage of her Seroquel.            Plan: Return again in one week.  Will continue psycho-education about bipolar.    Diagnosis: Bipolar I disorder, mixed    Darrin LuisSolomon, Sarah A, LCSW 05/24/2014

## 2014-05-24 NOTE — Progress Notes (Addendum)
Sarah Mclaughlin was in to see S. Lynnea FerrierSolomon today and I was asked to consult due to a problem with medication.  Sarah Mclaughlin reports that the Lamictal continues to make her vomit at night. She states she has vomited each time she has taken it.  She also notes that she has been hallucinating, seeing a "little girl who sits around her house." And goes on to ask her things such as "let's go to the other room."  Sarah Mclaughlin notes that this has been going on for about a year, but did not mention this when specifically asked about it on her last visit with this provider.  She also goes on to say that she was so dehydrated upon seeing her PCP she went by EMS to the hospital where she was evaluated for syncope.  She was found to have a +uds for Restoril, which she is prescribed.  And her electrolytes were abnormal. She was rehydrated and released.  She reported per the records that she took 2 of her BP medications because she thought that an elevated BP was causing her headaches.  Assessment: 1. Adverse reaction to medication. Plan: Will d/c Lamictal due to vomiting. 2. Patient continues to push the limits by asking for medication consult outside of scheduled visits for this provider. 3. [facility] that she stated that she took two of her BP pills on the same day she saw me. She was specifically told to take ALL medications as written and has disregarded that issue. 4. There is also some concern that she took her Seroquel prior to going to the PCP which would have accounted for her dizziness.  As would her Restoril. 5, I will start her on a Seroquel 100mg  dose at bed time and call this in to her pharmacy. 6. She is reminded again not to seek medications outside her appointments. 7. These concerns will be addressed with her on her next visit as well. She continues to be seeking medication as well as being an unreliable historian. ++ of note, the pharmacy called for the prescription before this note  could be completed. It was started BEFORE the patient left this office. Clearly she went directly to the pharmacy. 8. The pharmacist Onalee Huaavid noted that she did indeed pick up her Seroquel 50mg  po at 2:15 pm on the 15th of December. 9. Pharmacist Onalee HuaDavid is made aware that the patient is on a pain contract with us and is not to have refills for any medication, or to have pain meds, or benzos either.  He will notify us of any new prescriptions.Rona RavensNeil T. Ela Moffat RPAC 3:35 PM 05/24/2014   Addendum: on medication reconciliation it is found that the patient has refilled her Klonazepam prescription on 12/17, from Dr. Johnney Killianadiontchenko, which is in direct violation of the [facility].   Dr. Lavina Hammanadiontchenko's office is notified [facility]( [facility] for Dr. Elvera Lennox to contact me regarding this issue. Will try to coordinate a plan with him regarding her care if he is agreeable.  If not, then she will be [facility] due to her continued violations of this agreement.Rona RavensNeil T. Demiana Crumbley RPAC 3:53 PM 05/24/2014

## 2014-05-31 ENCOUNTER — Ambulatory Visit (HOSPITAL_COMMUNITY): Payer: Self-pay | Admitting: Licensed Clinical Social Worker

## 2014-06-06 ENCOUNTER — Ambulatory Visit (HOSPITAL_COMMUNITY): Payer: Self-pay | Admitting: Physician Assistant

## 2014-06-16 ENCOUNTER — Ambulatory Visit (HOSPITAL_COMMUNITY): Payer: Self-pay | Admitting: Licensed Clinical Social Worker

## 2014-06-23 ENCOUNTER — Ambulatory Visit (HOSPITAL_COMMUNITY): Payer: Self-pay | Admitting: Licensed Clinical Social Worker

## 2014-06-30 ENCOUNTER — Ambulatory Visit (HOSPITAL_COMMUNITY): Payer: Self-pay | Admitting: Licensed Clinical Social Worker

## 2014-07-07 ENCOUNTER — Ambulatory Visit (HOSPITAL_COMMUNITY): Payer: Self-pay | Admitting: Licensed Clinical Social Worker

## 2014-07-14 ENCOUNTER — Ambulatory Visit (HOSPITAL_COMMUNITY): Payer: Self-pay | Admitting: Licensed Clinical Social Worker

## 2014-07-21 ENCOUNTER — Ambulatory Visit (HOSPITAL_COMMUNITY): Payer: Self-pay | Admitting: Licensed Clinical Social Worker

## 2014-07-28 ENCOUNTER — Ambulatory Visit (HOSPITAL_COMMUNITY): Payer: Self-pay | Admitting: Licensed Clinical Social Worker

## 2014-08-10 IMAGING — CR DG ABDOMEN 2V
2 series · 2 of 2 positions shown · non-contrast
Comparison: None.

CLINICAL DATA: Abdominal pain and bloating.

ABDOMEN - 2 VIEW

[view not recorded (1 of 2)]
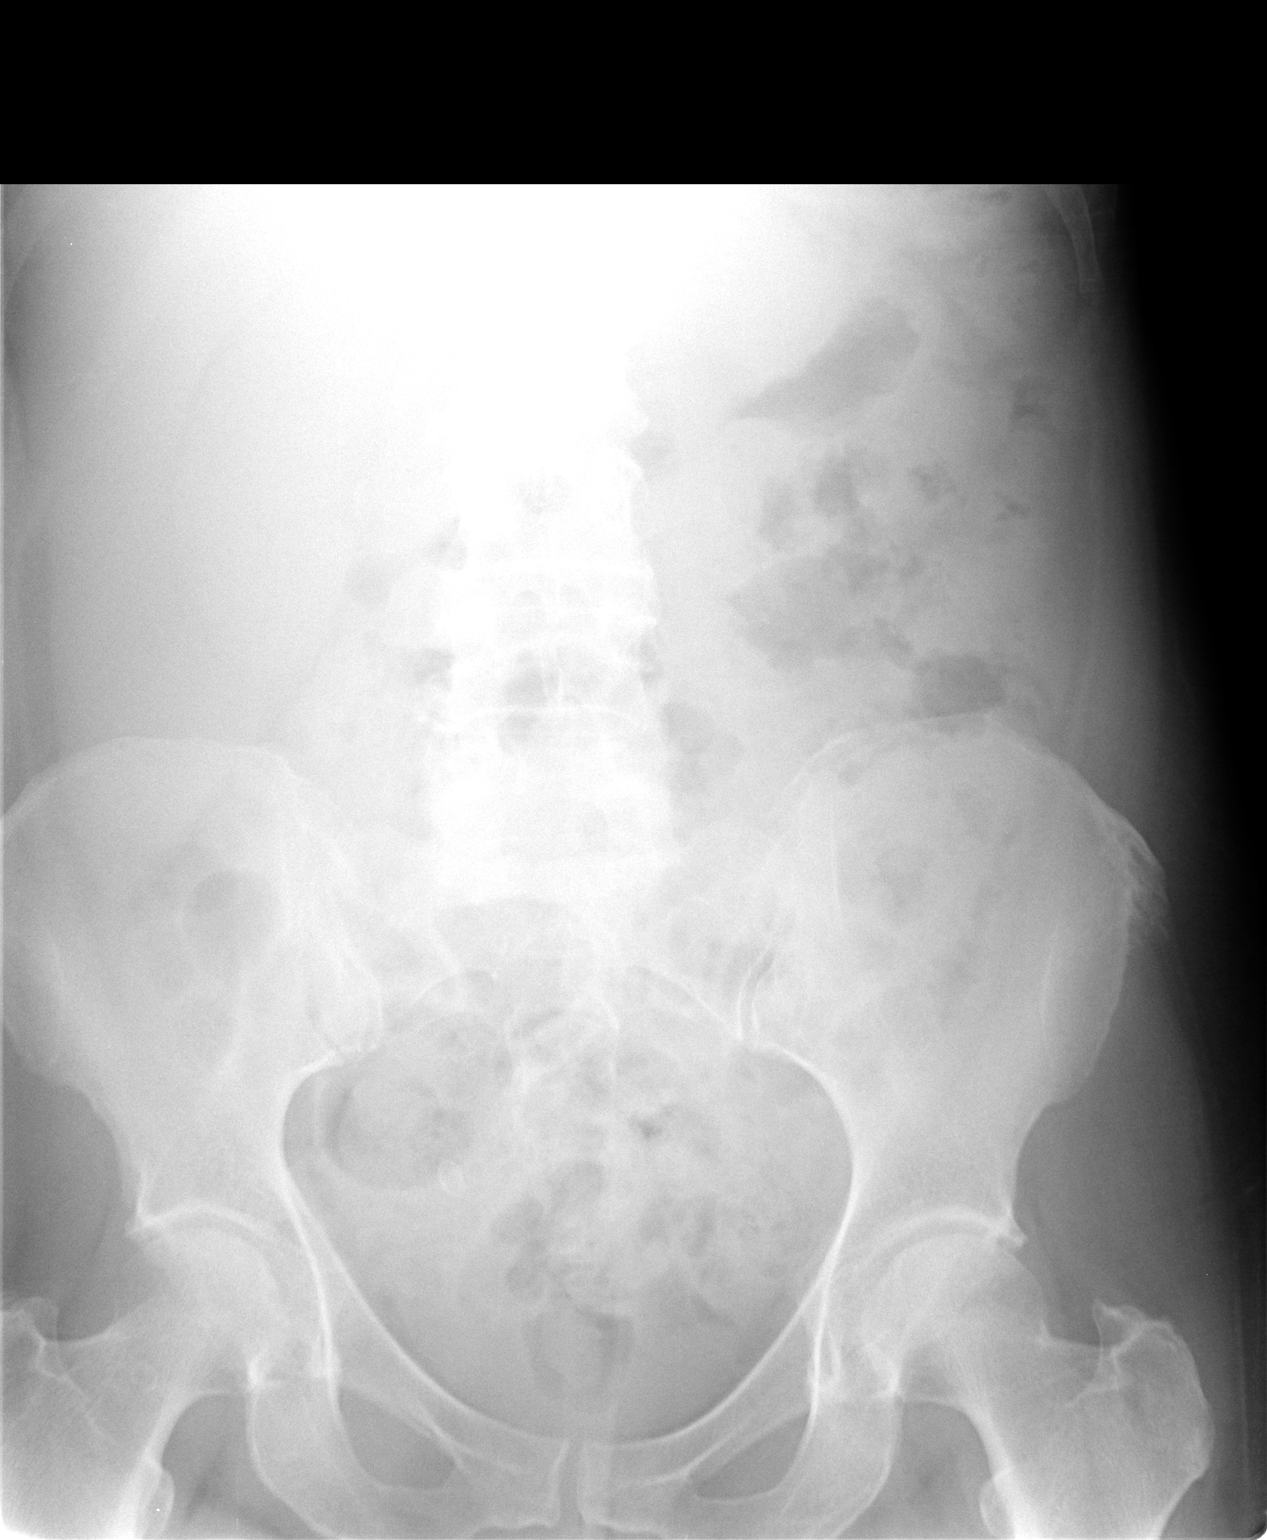

[view not recorded (2 of 2)]
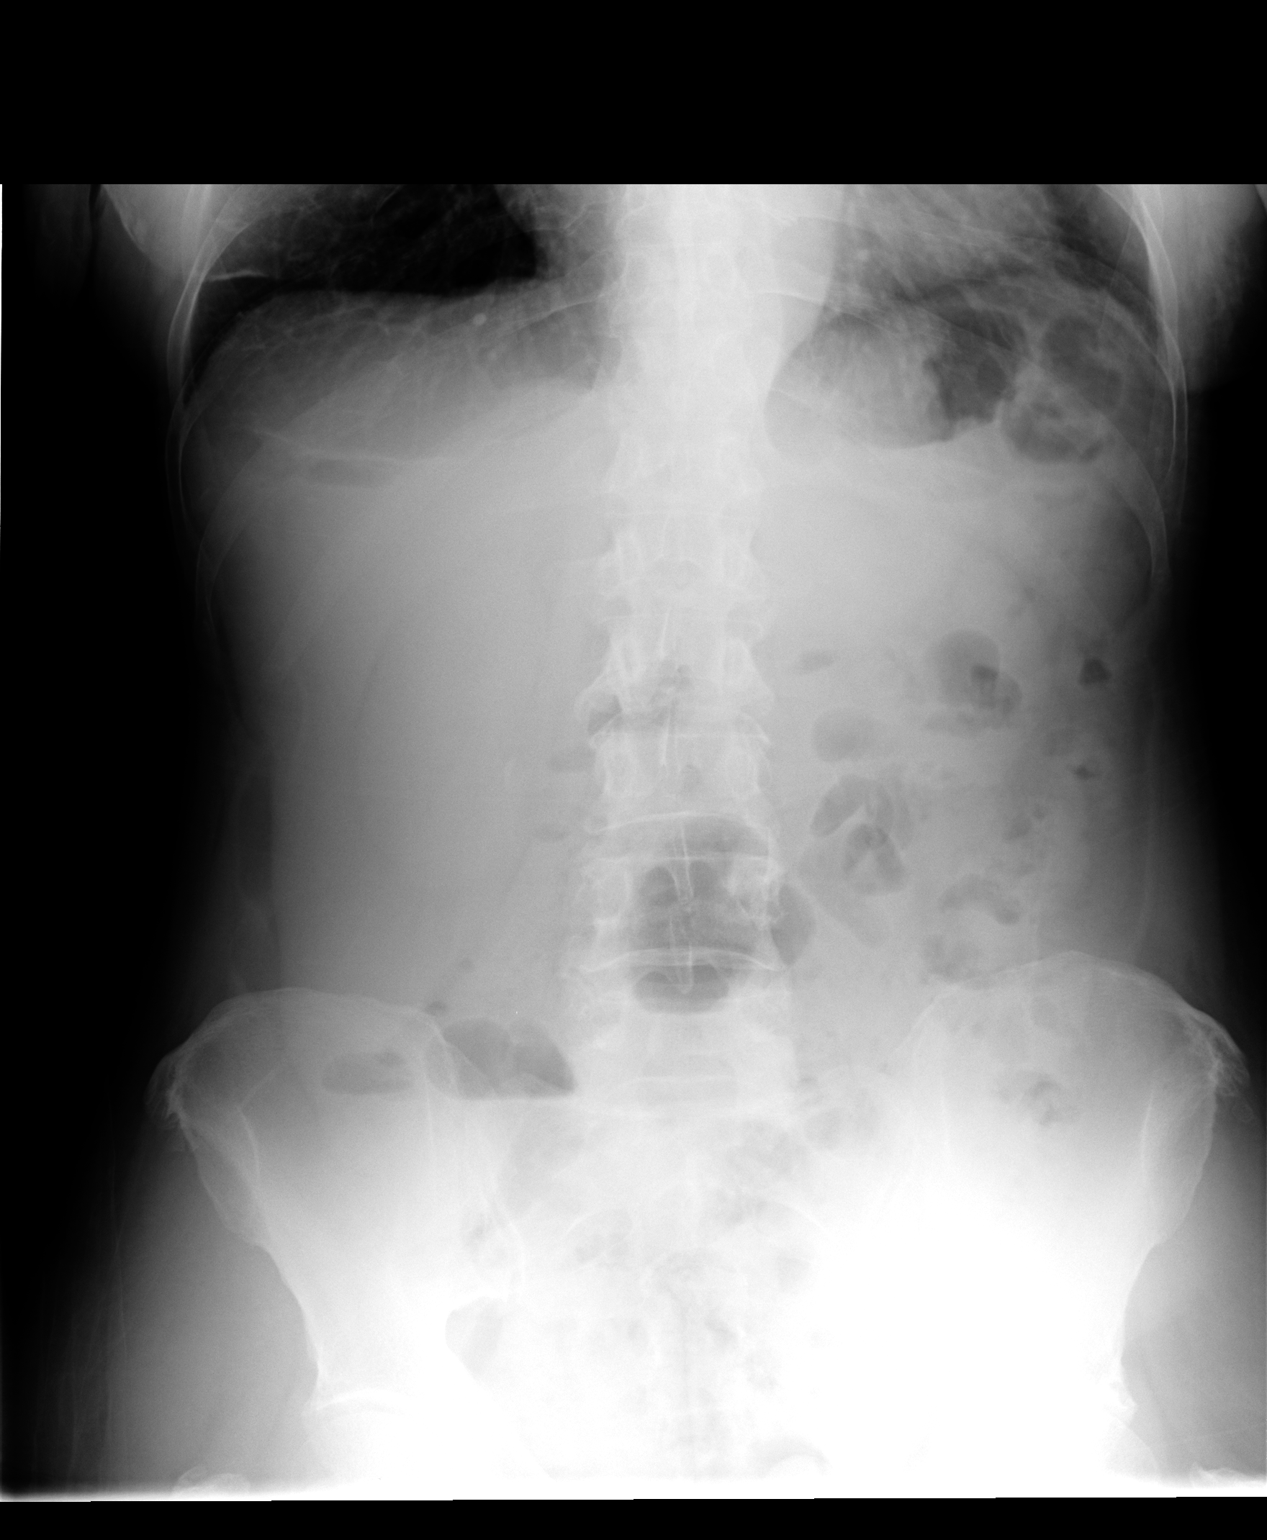

[2 of 2 positions shown; findings below may reference images not displayed]

FINDINGS: There is scattered air and some stool in the colon.
Scattered small bowel loops with air but no distention.  A few
scattered air fluid levels are noted.  The soft tissue shadows are
maintained.  No worrisome calcifications.  The bony structures are
intact.  The lung bases are grossly clear.
IMPRESSION: Possible mild / early ileus or gastroenteritis.  No findings for
obstruction and/or perforation.

## 2014-08-14 IMAGING — CT CT HEAD W/O CM
2 series · 16 of 30 positions shown, 20 images · non-contrast
Comparison: None.

CLINICAL DATA: Altered mental status.  Auditory and visual
hallucinations.

CT HEAD WITHOUT CONTRAST
TECHNIQUE: Contiguous axial images were obtained from the base of
the skull through the vertex without contrast.

[Series 2: head w/o · axial · non-contrast · 0.43mm/px · z∈[-278,-158]mm · 13 of 28 slices shown, 17 images]
[im 2/28  brain]
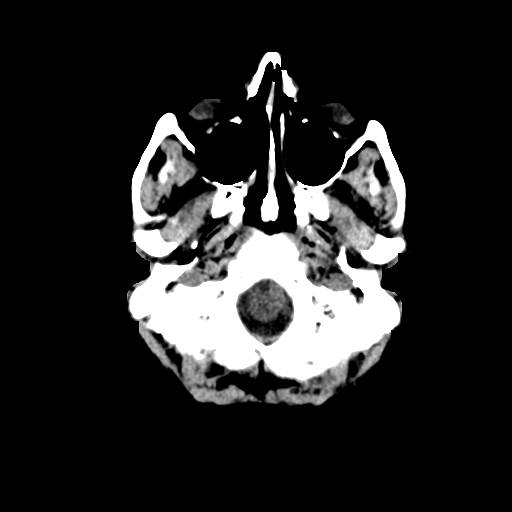
[im 2/28  bone]
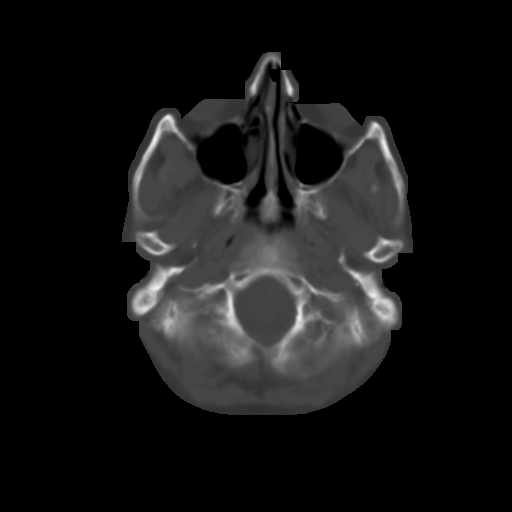
[im 4/28  brain]
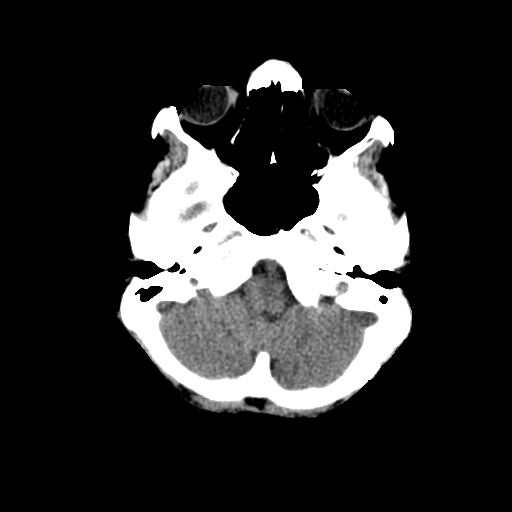
[im 6/28  brain]
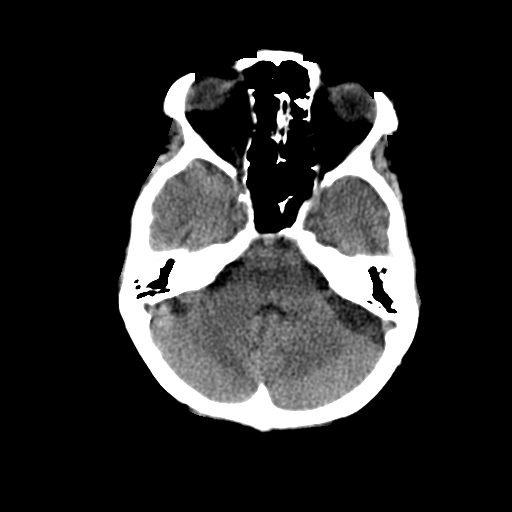
[im 8/28  brain]
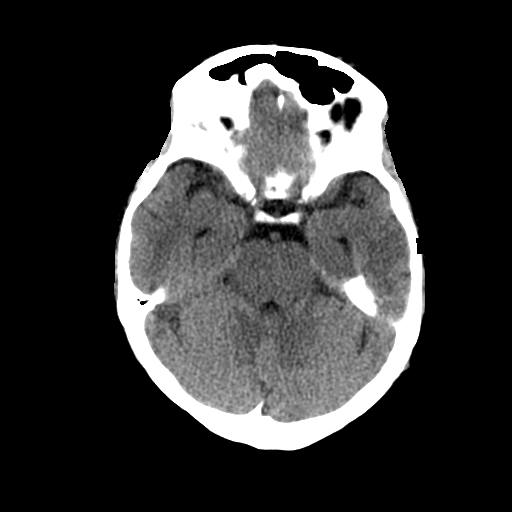
[im 10/28  brain]
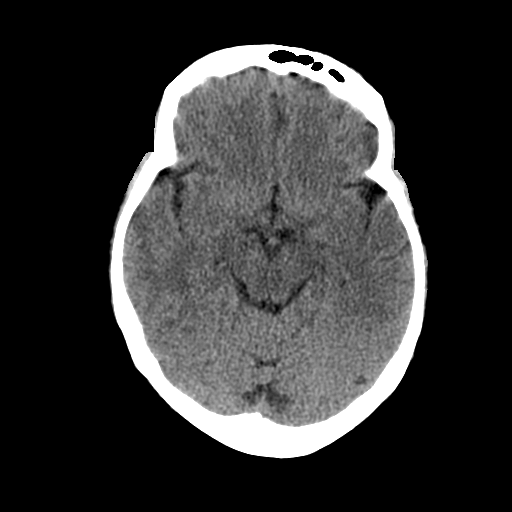
[im 10/28  bone]
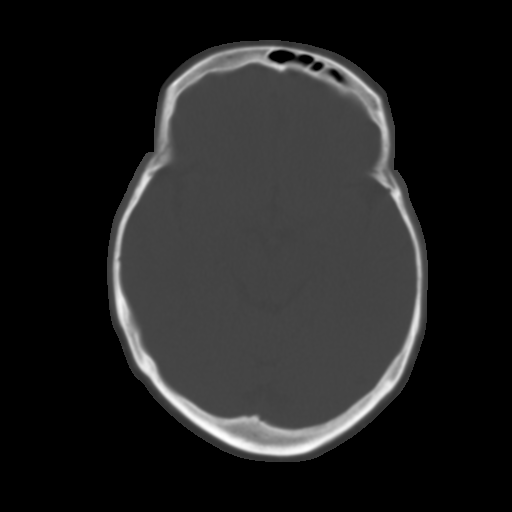
[im 12/28  brain]
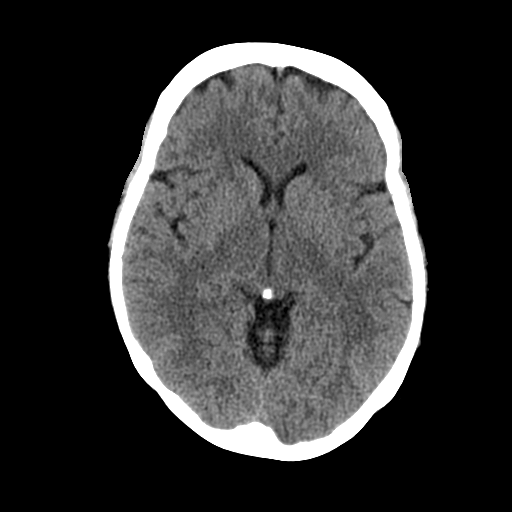
[im 14/28  brain]
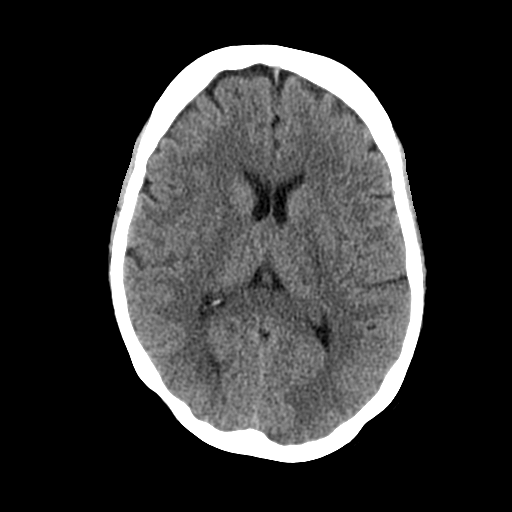
[im 16/28  brain]
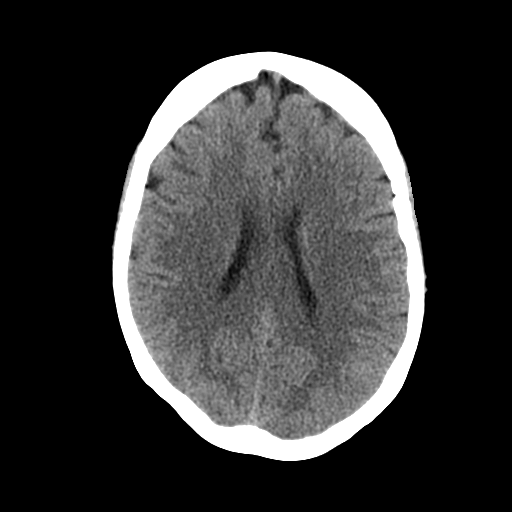
[im 18/28  brain]
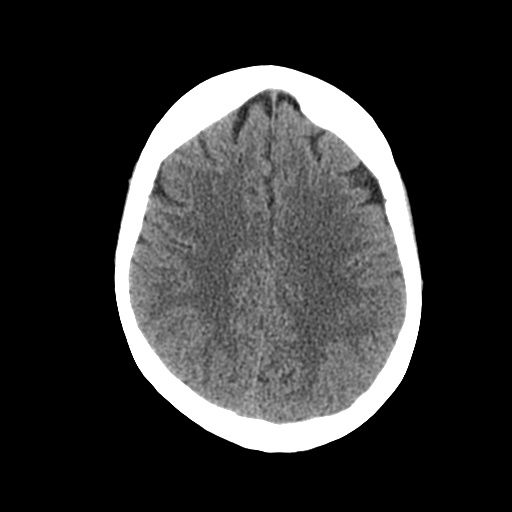
[im 18/28  bone]
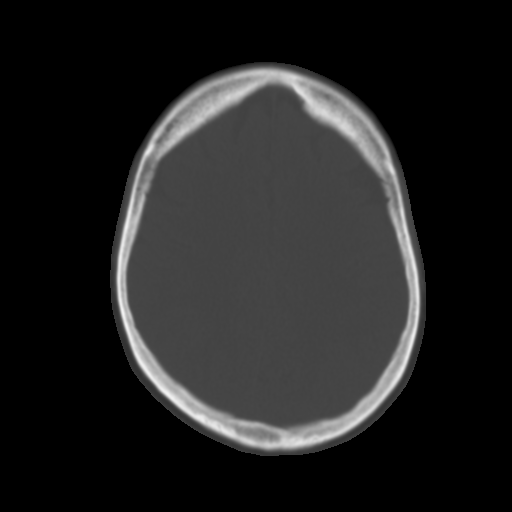
[im 20/28  brain]
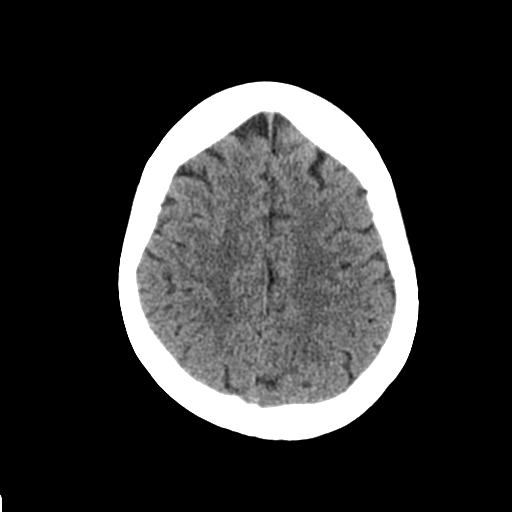
[im 22/28  brain]
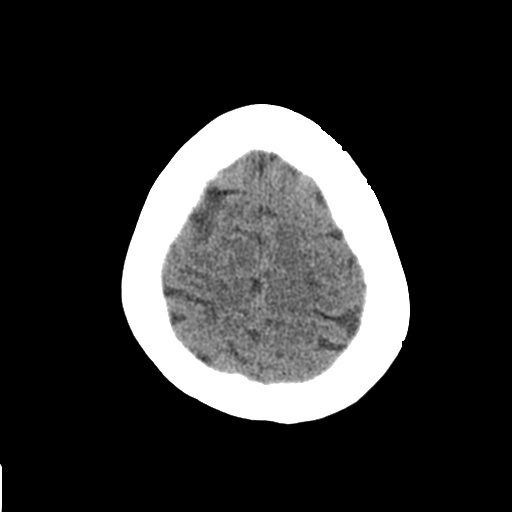
[im 24/28  brain]
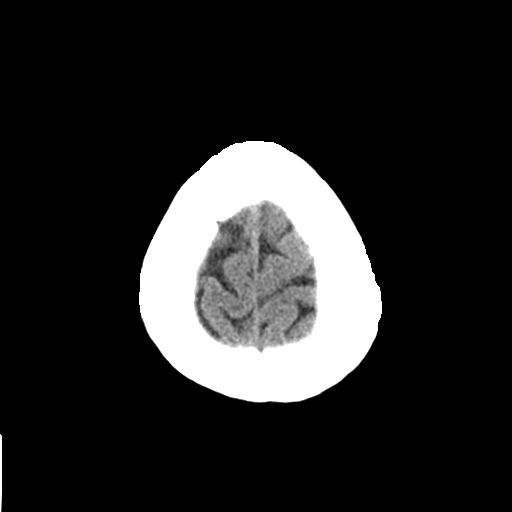
[im 26/28  brain]
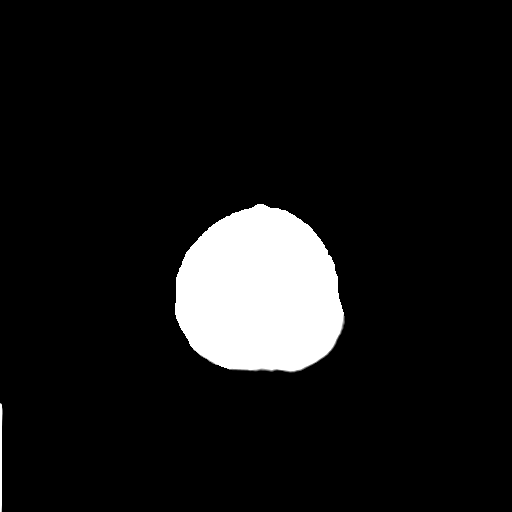
[im 26/28  bone]
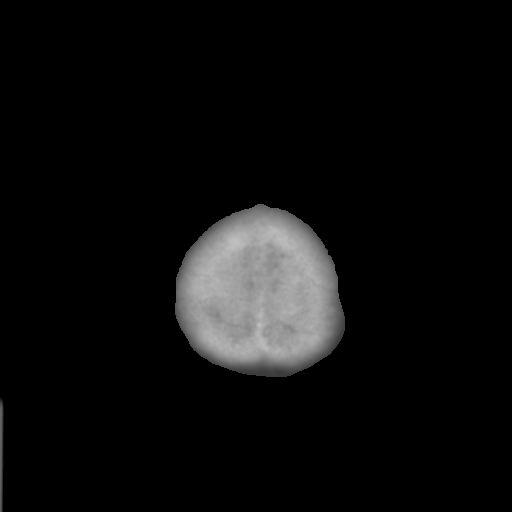

[Series 3: bone windows · axial · 0.43mm/px · z∈[-278,-238]mm · 3 of 28 slices shown]
[im 2/28  bone]
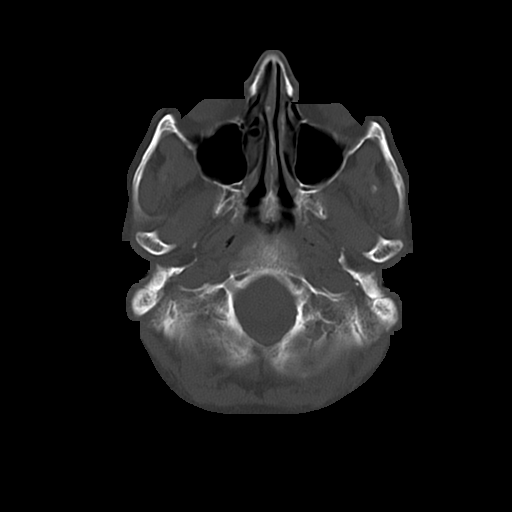
[im 6/28  bone]
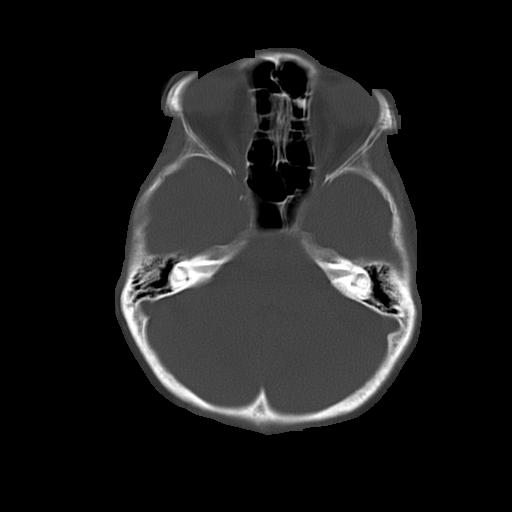
[im 10/28  bone]
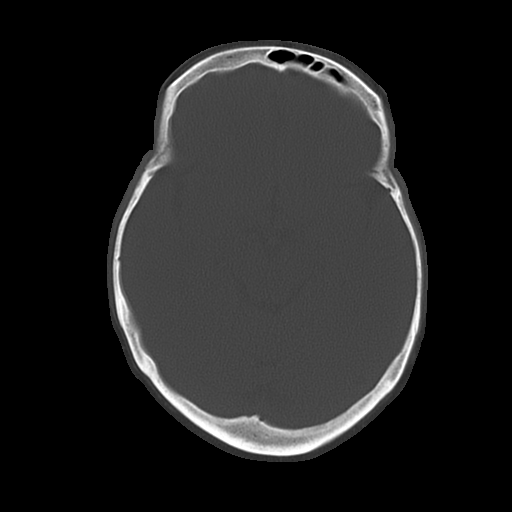

[16 of 30 positions shown; findings below may reference images not displayed]

FINDINGS: The brain is normal in appearance without infarct,
hemorrhage, mass lesion, mass effect, midline shift or abnormal
extra-axial fluid collection.  No hydrocephalus or pneumocephalus.
The calvarium is intact.  Imaged paranasal sinuses mastoid air
cells are clear.
IMPRESSION: Negative head CT.
